# Patient Record
Sex: Female | Born: 1965
Health system: Southern US, Community
[De-identification: ages and names within clinical notes are randomized; demographics above are authoritative.]

---

## 2011-10-16 ENCOUNTER — Encounter: Payer: Self-pay | Admitting: Internal Medicine

## 2011-10-16 ENCOUNTER — Ambulatory Visit (INDEPENDENT_AMBULATORY_CARE_PROVIDER_SITE_OTHER): Payer: Commercial Managed Care - PPO | Admitting: Internal Medicine

## 2011-10-16 VITALS — BP 100/60 | HR 60 | Temp 97.3°F | Resp 12 | Ht 61.5 in | Wt 123.0 lb

## 2011-10-16 DIAGNOSIS — Z0000 Encounter for general adult medical examination without abnormal findings: Secondary | ICD-10-CM | POA: Insufficient documentation

## 2011-10-16 DIAGNOSIS — Z113 Encounter for screening for infections with a predominantly sexual mode of transmission: Secondary | ICD-10-CM

## 2011-10-16 DIAGNOSIS — Z1272 Encounter for screening for malignant neoplasm of vagina: Secondary | ICD-10-CM

## 2011-10-16 DIAGNOSIS — Z139 Encounter for screening, unspecified: Secondary | ICD-10-CM

## 2011-10-16 DIAGNOSIS — M2550 Pain in unspecified joint: Secondary | ICD-10-CM

## 2011-10-16 DIAGNOSIS — Z01419 Encounter for gynecological examination (general) (routine) without abnormal findings: Secondary | ICD-10-CM

## 2011-10-16 LAB — POCT URINALYSIS DIPSTICK
Bilirubin, UA: NEGATIVE
Ketones, UA: NEGATIVE
Protein, UA: NEGATIVE
Spec Grav, UA: 1.02
pH, UA: 6.5

## 2011-10-16 NOTE — Progress Notes (Signed)
Subjective:    Patient ID: Christy Martinez, female    DOB: 10/27/1966, 45 y.o.   MRN: [id number]  HPI New pt here for first visit.  Former pt of Dr. Ethelene Hal in [place].   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  NO FH of breast or GYN cancers  She has joint pain in DIP joint of 5th digits both hands.  No redness or warmth.  At time MTP joint of R great toe will bother her  No Known Allergies History reviewed. No pertinent past medical history. History reviewed. No pertinent past surgical history. History   Social History  . Marital Status: Married    Spouse Name: N/A    Number of Children: N/A  . Years of Education: N/A   Occupational History  . Not on file.   Social History Main Topics  . Smoking status: Never Smoker   . Smokeless tobacco: Never Used  . Alcohol Use: No  . Drug Use: No  . Sexually Active: Yes    Birth Control/ Protection: Coitus interruptus   Other Topics Concern  . Not on file   Social History Narrative  . No narrative on file   Family History  Problem Relation Age of Onset  . Diabetes Father   . Hypertension Father    Patient Active Problem List  Diagnoses  . Joint pain  . Screening   No current outpatient prescriptions on file prior to visit.       Review of Systems No chest pain , no sob NO DOE No LE edema No blood or change in color of stool  No dyspepsi No dysuria or urgency    Objective:   Physical Exam No Known Allergies History reviewed. No pertinent past medical history. History reviewed. No pertinent past surgical history. History   Social History  . Marital Status: Married    Spouse Name: N/A    Number of Children: N/A  . Years of Education: N/A   Occupational History  . Not on file.   Social History Main Topics  . Smoking status: Never Smoker     . Smokeless tobacco: Never Used  . Alcohol Use: No  . Drug Use: No  . Sexually Active: Yes    Birth Control/ Protection: Coitus interruptus   Other Topics Concern  . Not on file   Social History Narrative  . No narrative on file   Family History  Problem Relation Age of Onset  . Diabetes Father   . Hypertension Father    Patient Active Problem List  Diagnoses  . Joint pain  . Screening   No current outpatient prescriptions on file prior to visit.    Physical Exam  Vital signs and nursing note reviewed  Constitutional: She is oriented to person, place, and time. She appears well-developed and well-nourished. She is cooperative.  HENT:  Head: Normocephalic and atraumatic.  Right Ear: Tympanic membrane normal.  Left Ear: Tympanic membrane normal.  Nose: Nose normal.  Mouth/Throat: Oropharynx is clear and moist and mucous membranes are normal. No oropharyngeal exudate or posterior oropharyngeal erythema.  Eyes: Conjunctivae and EOM are normal. Pupils are equal, round, and reactive to light.  Neck: Neck supple. No JVD present. Carotid bruit is not present. No mass and  no thyromegaly present.  Cardiovascular: Regular rhythm, normal heart sounds, intact distal pulses and normal pulses.  Exam reveals no gallop and no friction rub.   No murmur heard. Pulses:      Dorsalis pedis pulses are 2+ on the right side, and 2+ on the left side.  Pulmonary/Chest: Breath sounds normal. She has no wheezes. She has no rhonchi. She has no rales. Right breast exhibits no mass, no nipple discharge and no skin change. Left breast exhibits no mass, no nipple discharge and no skin change.  Abdominal: Soft. Bowel sounds are normal. She exhibits no distension and no mass. There is no hepatosplenomegaly. There is no tenderness. There is no CVA tenderness.  Genitourinary: Rectum normal, vagina normal and uterus normal. Rectal exam shows no mass. Guaiac negative stool. No labial fusion. There is no lesion  on the right labia. There is no lesion on the left labia. Cervix exhibits no motion tenderness. Right adnexum displays no mass, no tenderness and no fullness. Left adnexum displays no mass, no tenderness and no fullness. No erythema around the vagina.  Musculoskeletal:       No active synovitis to any joint.    Lymphadenopathy:       Right cervical: No superficial cervical adenopathy present.      Left cervical: No superficial cervical adenopathy present.       Right axillary: No pectoral and no lateral adenopathy present.       Left axillary: No pectoral and no lateral adenopathy present.      Right: No inguinal adenopathy present.       Left: No inguinal adenopathy present.  Neurological: She is alert and oriented to person, place, and time. She has normal strength and normal reflexes. No cranial nerve deficit or sensory deficit. She displays a negative Romberg sign. Coordination and gait normal.  Skin: Skin is warm and dry. No abrasion, no bruising, no ecchymosis and no rash noted. No cyanosis. Nails show no clubbing.  Psychiatric: She has a normal mood and affect. Her speech is normal and behavior is normal.          Assessment & Plan:  Health Maintenance;  She is to go to employee health for TDAP today.  Had influenza  See scanned HM sheet 2)  Joint arthraligias 5th digit;  OK to try empiric glucosamine/chondroitin for 4-6 weeks.  If it does not help counseled to stop med       Assessment & Plan:

## 2011-10-16 NOTE — Patient Instructions (Signed)
Labs will be mailed to you  Can try Glucosamine chondroitin g as directed for joint pain  Get Tdap at employee health fax to [place]

## 2011-10-18 LAB — COMPREHENSIVE METABOLIC PANEL
AST: 13 U/L (ref 0–37)
Albumin: 4.4 g/dL (ref 3.5–5.2)
BUN: 13 mg/dL (ref 6–23)
Calcium: 8.9 mg/dL (ref 8.4–10.5)
Chloride: 105 mEq/L (ref 96–112)
Creat: 0.67 mg/dL (ref 0.50–1.10)
Glucose, Bld: 87 mg/dL (ref 70–99)
Potassium: 3.9 mEq/L (ref 3.5–5.3)

## 2011-10-18 LAB — CBC WITH DIFFERENTIAL/PLATELET
Basophils Absolute: 0 10*3/uL (ref 0.0–0.1)
Eosinophils Relative: 2 % (ref 0–5)
HCT: 39.5 % (ref 36.0–46.0)
Hemoglobin: 13.3 g/dL (ref 12.0–15.0)
Lymphocytes Relative: 36 % (ref 12–46)
MCHC: 33.7 g/dL (ref 30.0–36.0)
MCV: 93.4 fL (ref 78.0–100.0)
Monocytes Absolute: 0.5 10*3/uL (ref 0.1–1.0)
Monocytes Relative: 11 % (ref 3–12)
Neutro Abs: 2.2 10*3/uL (ref 1.7–7.7)
RDW: 12.7 % (ref 11.5–15.5)

## 2011-10-18 LAB — LIPID PANEL
Cholesterol: 171 mg/dL (ref 0–200)
HDL: 57 mg/dL (ref >39)
LDL Cholesterol: 102 mg/dL — ABNORMAL HIGH (ref 0–99)
Triglycerides: 61 mg/dL (ref <150)

## 2011-10-18 LAB — TSH: TSH: 1.181 u[IU]/mL (ref 0.350–4.500)

## 2011-10-20 ENCOUNTER — Encounter: Payer: Self-pay | Admitting: Emergency Medicine

## 2011-10-21 ENCOUNTER — Encounter: Payer: Self-pay | Admitting: Emergency Medicine

## 2011-10-24 ENCOUNTER — Encounter: Payer: Self-pay | Admitting: Internal Medicine

## 2012-09-23 ENCOUNTER — Ambulatory Visit (INDEPENDENT_AMBULATORY_CARE_PROVIDER_SITE_OTHER): Payer: Commercial Managed Care - PPO | Admitting: Internal Medicine

## 2012-09-23 ENCOUNTER — Encounter: Payer: Self-pay | Admitting: Internal Medicine

## 2012-09-23 VITALS — BP 108/68 | HR 74 | Temp 97.0°F | Resp 16 | Ht 62.0 in | Wt 128.0 lb

## 2012-09-23 DIAGNOSIS — M5430 Sciatica, unspecified side: Secondary | ICD-10-CM | POA: Insufficient documentation

## 2012-09-23 MED ORDER — METAXALONE 800 MG PO TABS: 800.0000 mg | ORAL_TABLET | Freq: Three times a day (TID) | ORAL | Status: DC

## 2012-09-23 MED ORDER — NABUMETONE 500 MG PO TABS: ORAL_TABLET | ORAL | Status: DC

## 2012-09-23 NOTE — Progress Notes (Signed)
  Subjective:    Patient ID: Christy Martinez, female    DOB: Aug 27, 1966, 46 y.o.   MRN: [id number]  HPI [name] is here for acute visit.    She for the last month has had Low back pain L >R and has pain going down L leg. Shehas had this before but it has been daily for the past 4 weeks.  No CVA pain no dysuria no urinary urgency.    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 No pertinent past medical history. History reviewed. No pertinent past surgical history. History   Social History  . Marital Status: Married    Spouse Name: N/A    Number of Children: N/A  . Years of Education: N/A   Occupational History  . Not on file.   Social History Main Topics  . Smoking status: Never Smoker   . Smokeless tobacco: Never Used  . Alcohol Use: No  . Drug Use: No  . Sexually Active: Yes    Birth Control/ Protection: Coitus interruptus   Other Topics Concern  . Not on file   Social History Narrative  . No narrative on file   Family History  Problem Relation Age of Onset  . Diabetes Father   . Hypertension Father    Patient Active Problem List  Diagnosis  . Joint pain  . Screening   Current Outpatient Prescriptions on File Prior to Visit  Medication Sig Dispense Refill  . Calcium Carbonate-Vitamin D (CALTRATE 600+D) 600-400 MG-UNIT per tablet Take 1 tablet by mouth daily.        . Cyanocobalamin (VITAMIN B-12 CR PO) Take 1 tablet by mouth daily.             Review of Systems    see HPI Objective:   Physical Exam Physical Exam  Nursing note and vitals reviewed.  Constitutional: She is oriented to person, place, and time. She appears well-developed and well-nourished.  HENT:  Head: Normocephalic and atraumatic.  Cardiovascular: Normal rate and regular rhythm. Exam reveals no gallop and no friction rub.  No murmur heard.  Pulmonary/Chest: Breath sounds normal. She has no wheezes. She has no  rales.  Neurological: She is alert and oriented to person, place, and time.  LE exam:   SLR positive on L.   Reflexes 2 + symmetric lowe est Sensation intact Motor 5/5 lower extremities Skin: Skin is warm and dry.  Psychiatric: She has a normal mood and affect. Her behavior is normal.              Assessment & Plan:  Sciatica:  Will treat with Relafen 500 mg bid with food.  Skelaxin 800 mg q8h as needed.  Will refer to PT  Back pain  See above    She is to see me in 3-4 weeks.  If no improvement ,  May need imaging

## 2012-09-23 NOTE — Patient Instructions (Addendum)
Take meds as prescribed   Will start physical therapy

## 2012-09-29 ENCOUNTER — Encounter: Payer: Self-pay | Admitting: *Deleted

## 2013-09-04 ENCOUNTER — Telehealth: Payer: Self-pay | Admitting: Internal Medicine

## 2013-09-04 NOTE — Telephone Encounter (Signed)
Left message on pts mobile to call office regarding extreme breast density on mm

## 2013-09-19 ENCOUNTER — Telehealth: Payer: Self-pay | Admitting: Internal Medicine

## 2013-09-19 NOTE — Telephone Encounter (Signed)
Left message again for pt to call office regarading mm results of extreme breast density

## 2013-10-21 ENCOUNTER — Encounter: Payer: Self-pay | Admitting: *Deleted

## 2014-02-27 ENCOUNTER — Encounter: Payer: Self-pay | Admitting: Internal Medicine

## 2014-02-27 ENCOUNTER — Ambulatory Visit (INDEPENDENT_AMBULATORY_CARE_PROVIDER_SITE_OTHER): Payer: Commercial Managed Care - PPO | Admitting: Internal Medicine

## 2014-02-27 ENCOUNTER — Other Ambulatory Visit: Payer: Self-pay | Admitting: Internal Medicine

## 2014-02-27 VITALS — BP 90/53 | HR 65 | Temp 98.3°F | Resp 18 | Ht 62.0 in | Wt 125.0 lb

## 2014-02-27 DIAGNOSIS — N6459 Other signs and symptoms in breast: Secondary | ICD-10-CM

## 2014-02-27 DIAGNOSIS — Z124 Encounter for screening for malignant neoplasm of cervix: Secondary | ICD-10-CM

## 2014-02-27 DIAGNOSIS — Z139 Encounter for screening, unspecified: Secondary | ICD-10-CM

## 2014-02-27 DIAGNOSIS — Z0000 Encounter for general adult medical examination without abnormal findings: Secondary | ICD-10-CM

## 2014-02-27 DIAGNOSIS — M2550 Pain in unspecified joint: Secondary | ICD-10-CM

## 2014-02-27 DIAGNOSIS — Z1151 Encounter for screening for human papillomavirus (HPV): Secondary | ICD-10-CM

## 2014-02-27 DIAGNOSIS — R922 Inconclusive mammogram: Secondary | ICD-10-CM | POA: Insufficient documentation

## 2014-02-27 LAB — URIC ACID: URIC ACID, SERUM: 4.7 mg/dL (ref 2.4–7.0)

## 2014-02-27 LAB — LIPID PANEL
CHOL/HDL RATIO: 2.6 ratio
Cholesterol: 169 mg/dL (ref 0–200)
HDL: 66 mg/dL (ref >39)
LDL CALC: 92 mg/dL (ref 0–99)
TRIGLYCERIDES: 55 mg/dL (ref <150)
VLDL: 11 mg/dL (ref 0–40)

## 2014-02-27 LAB — CBC WITH DIFFERENTIAL/PLATELET
BASOS ABS: 0 10*3/uL (ref 0.0–0.1)
BASOS PCT: 1 % (ref 0–1)
EOS PCT: 2 % (ref 0–5)
Eosinophils Absolute: 0.1 10*3/uL (ref 0.0–0.7)
HEMATOCRIT: 37.8 % (ref 36.0–46.0)
Hemoglobin: 12.9 g/dL (ref 12.0–15.0)
LYMPHS PCT: 45 % (ref 12–46)
Lymphs Abs: 1.6 10*3/uL (ref 0.7–4.0)
MCH: 31.3 pg (ref 26.0–34.0)
MCHC: 34.1 g/dL (ref 30.0–36.0)
MCV: 91.7 fL (ref 78.0–100.0)
MONO ABS: 0.4 10*3/uL (ref 0.1–1.0)
Monocytes Relative: 12 % (ref 3–12)
Neutro Abs: 1.4 10*3/uL — ABNORMAL LOW (ref 1.7–7.7)
Neutrophils Relative %: 40 % — ABNORMAL LOW (ref 43–77)
Platelets: 206 10*3/uL (ref 150–400)
RBC: 4.12 MIL/uL (ref 3.87–5.11)
RDW: 13.6 % (ref 11.5–15.5)
WBC: 3.6 10*3/uL — ABNORMAL LOW (ref 4.0–10.5)

## 2014-02-27 LAB — POCT URINALYSIS DIPSTICK
Bilirubin, UA: NEGATIVE
Blood, UA: NEGATIVE
Glucose, UA: NEGATIVE
KETONES UA: NEGATIVE
LEUKOCYTES UA: NEGATIVE
Nitrite, UA: NEGATIVE
Protein, UA: NEGATIVE
Spec Grav, UA: 1.015
Urobilinogen, UA: NEGATIVE
pH, UA: 6.5

## 2014-02-27 LAB — COMPREHENSIVE METABOLIC PANEL
ALT: 13 U/L (ref 0–35)
AST: 18 U/L (ref 0–37)
Albumin: 4.2 g/dL (ref 3.5–5.2)
Alkaline Phosphatase: 35 U/L — ABNORMAL LOW (ref 39–117)
BILIRUBIN TOTAL: 0.6 mg/dL (ref 0.2–1.2)
BUN: 10 mg/dL (ref 6–23)
CO2: 27 mEq/L (ref 19–32)
CREATININE: 0.55 mg/dL (ref 0.50–1.10)
Calcium: 9 mg/dL (ref 8.4–10.5)
Chloride: 105 mEq/L (ref 96–112)
Glucose, Bld: 87 mg/dL (ref 70–99)
Potassium: 4.3 mEq/L (ref 3.5–5.3)
Sodium: 140 mEq/L (ref 135–145)
Total Protein: 7.1 g/dL (ref 6.0–8.3)

## 2014-02-27 NOTE — Progress Notes (Signed)
   Subjective:    Patient ID: Christy AschoffMary Christy Martinez, female    DOB: 06/01/1966, 48 y.o.   MRN: [id number]  HPI Christy ShinerGrace is here for CPE.  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 [employer]  See MM  She has extreme breast density  She also report one episode of red toe with pain.  Only lasted a few days.    Review of Systems     Objective:   Physical Exam  Physical Exam  Vital signs and nursing note reviewed  Constitutional: She is oriented to person, place, and time. She appears well-developed and well-nourished. She is cooperative.  HENT:  Head: Normocephalic and atraumatic.  Right Ear: Tympanic membrane normal.  Left Ear: Tympanic membrane normal.  Nose: Nose normal.  Mouth/Throat: Oropharynx is clear and moist and mucous membranes are normal. No oropharyngeal exudate or posterior oropharyngeal erythema.  Eyes: Conjunctivae and EOM are normal. Pupils are equal, round, and reactive to light.  Neck: Neck supple. No JVD present. Carotid bruit is not present. No mass and no thyromegaly present.  Cardiovascular: Regular rhythm, normal heart sounds, intact distal pulses and normal pulses.  Exam reveals no gallop and no friction rub.   No murmur heard. Pulses:      Dorsalis pedis pulses are 2+ on the right side, and 2+ on the left side.  Pulmonary/Chest: Breath sounds normal. She has no wheezes. She has no rhonchi. She has no rales. Right breast exhibits no mass, no nipple discharge and no skin change. Left breast exhibits no mass, no nipple discharge and no skin change.  Abdominal: Soft. Bowel sounds are normal. She exhibits no distension and no mass. There is no hepatosplenomegaly. There is no tenderness. There is no CVA tenderness.  Genitourinary: Rectum normal, vagina normal and uterus normal. Rectal exam shows no mass. . No labial fusion. There is no lesion on the right labia. There is no lesion on the left labia. Cervix exhibits no motion tenderness. Right adnexum displays no mass, no tenderness and no  fullness. Left adnexum displays no mass, no tenderness and no fullness. No erythema around the vagina.  Musculoskeletal:       No active synovitis to any joint.    Lymphadenopathy:       Right cervical: No superficial cervical adenopathy present.      Left cervical: No superficial cervical adenopathy present.       Right axillary: No pectoral and no lateral adenopathy present.       Left axillary: No pectoral and no lateral adenopathy present.      Right: No inguinal adenopathy present.       Left: No inguinal adenopathy present.  Neurological: She is alert and oriented to person, place, and time. She has normal strength and normal reflexes. No cranial nerve deficit or sensory deficit. She displays a negative Romberg sign. Coordination and gait normal.  Skin: Skin is warm and dry. No abrasion, no bruising, no ecchymosis and no rash noted. No cyanosis. Nails show no clubbing.  Psychiatric: She has a normal mood and affect. Her speech is normal and behavior is normal.          Assessment & Plan:  Health maintenance:    Extreme breast density  Advised 3D mm next year  Toe pain and redness  Will check uric acid today.  Clinical exam no synovitis  Labs today see me as needed         Assessment & Plan:

## 2014-02-28 LAB — TSH: TSH: 1.347 u[IU]/mL (ref 0.350–4.500)

## 2014-02-28 LAB — VITAMIN D 25 HYDROXY (VIT D DEFICIENCY, FRACTURES): Vit D, 25-Hydroxy: 45 ng/mL (ref 30–89)

## 2014-03-01 ENCOUNTER — Encounter: Payer: Self-pay | Admitting: *Deleted

## 2014-06-20 ENCOUNTER — Encounter: Payer: Self-pay | Admitting: Internal Medicine

## 2014-06-20 ENCOUNTER — Ambulatory Visit (INDEPENDENT_AMBULATORY_CARE_PROVIDER_SITE_OTHER): Payer: 59 | Admitting: Internal Medicine

## 2014-06-20 VITALS — BP 110/68 | HR 60 | Temp 98.6°F | Resp 16 | Ht 62.0 in | Wt 121.0 lb

## 2014-06-20 DIAGNOSIS — R21 Rash and other nonspecific skin eruption: Secondary | ICD-10-CM

## 2014-06-20 MED ORDER — HYDROXYZINE HCL 10 MG PO TABS: ORAL_TABLET | ORAL | Status: DC

## 2014-06-20 MED ORDER — PERMETHRIN 5 % EX CREA: 1.0000 "application " | TOPICAL_CREAM | Freq: Once | CUTANEOUS | Status: DC

## 2014-06-20 MED ORDER — DOXYCYCLINE HYCLATE 100 MG PO TABS: 100.0000 mg | ORAL_TABLET | Freq: Two times a day (BID) | ORAL | Status: DC

## 2014-06-20 NOTE — Patient Instructions (Signed)
Call my office if no improvement  Keep appointment with dermatologist

## 2014-06-20 NOTE — Progress Notes (Signed)
   Subjective:    Patient ID: Christy Martinez, female    DOB: 02/04/1966, 48 y.o.   MRN: [id number]  HPI  Christy DandyMary is here for acute visit  Itchy maculopapular rash  Began over both lower arms 3 weeks ago and know has spread to chest, back and both legs.    Treated with OTC HC only.  She works as Engineer, civil (consulting)nurse on urology unit at [employer]   No headache no fever.  Tried new detergent no new foods or medications.    She walks outside  For 1 hour daily    No Known Allergies No past medical history on file. No past surgical history on file. History   Social History  . Marital Status: Married    Spouse Name: N/A    Number of Children: N/A  . Years of Education: N/A   Occupational History  . Not on file.   Social History Main Topics  . Smoking status: Never Smoker   . Smokeless tobacco: Never Used  . Alcohol Use: No  . Drug Use: No  . Sexual Activity: Yes    Birth Control/ Protection: Coitus interruptus   Other Topics Concern  . Not on file   Social History Narrative  . No narrative on file   Family History  Problem Relation Age of Onset  . Diabetes Father   . Hypertension Father    Patient Active Problem List   Diagnosis Date Noted  . Breast density 02/27/2014  . Sciatica 09/23/2012  . Joint pain 10/16/2011  . Screening 10/16/2011   Current Outpatient Prescriptions on File Prior to Visit  Medication Sig Dispense Refill  . acetaminophen (TYLENOL) 500 MG tablet Take 500 mg by mouth every 6 (six) hours as needed.      . Calcium Carbonate-Vitamin D (CALTRATE 600+D) 600-400 MG-UNIT per tablet Take 1 tablet by mouth daily.        . Cyanocobalamin (VITAMIN B-12 CR PO) Take 1 tablet by mouth daily.        . metaxalone (SKELAXIN) 800 MG tablet Take 1 tablet (800 mg total) by mouth 3 (three) times daily.  30 tablet  0  . nabumetone (RELAFEN) 500 MG tablet Take one tablet twice a day with food  60 tablet  1   No current facility-administered medications on file prior to visit.       Review of Systems See HPI    Objective:   Physical Exam  Physical Exam  Nursing note and vitals reviewed.  Constitutional: She is oriented to person, place, and time. She appears well-developed and well-nourished.  HENT:  Head: Normocephalic and atraumatic.  Cardiovascular: Normal rate and regular rhythm. Exam reveals no gallop and no friction rub.  No murmur heard.  Pulmonary/Chest: Breath sounds normal. She has no wheezes. She has no rales.  Neurological: She is alert and oriented to person, place, and time.  Skin: Skin is warm and dry.  She has multiple maculopapular lesion from neck down.     No blisters.    No cellulitis  Psychiatric: She has a normal mood and affect. Her behavior is normal.             Assessment & Plan:  Probable scabies : will treat with elimite -repeat in 1 week.  Pt has appt with dermatology group later this month.      Will also empirically treat with Doxycycline   100 mg bid   Call me if no improvement

## 2014-07-18 ENCOUNTER — Other Ambulatory Visit: Payer: Self-pay | Admitting: Internal Medicine

## 2014-07-18 DIAGNOSIS — Z1231 Encounter for screening mammogram for malignant neoplasm of breast: Secondary | ICD-10-CM

## 2014-08-24 ENCOUNTER — Ambulatory Visit (INDEPENDENT_AMBULATORY_CARE_PROVIDER_SITE_OTHER): Payer: 59 | Admitting: Internal Medicine

## 2014-08-24 ENCOUNTER — Encounter: Payer: Self-pay | Admitting: Internal Medicine

## 2014-08-24 VITALS — BP 104/60 | HR 68 | Temp 98.0°F | Resp 16 | Wt 125.0 lb

## 2014-08-24 DIAGNOSIS — L500 Allergic urticaria: Secondary | ICD-10-CM

## 2014-08-24 DIAGNOSIS — Z91012 Allergy to eggs: Secondary | ICD-10-CM

## 2014-08-24 MED ORDER — PREDNISONE 20 MG PO TABS: 20.0000 mg | ORAL_TABLET | Freq: Every day | ORAL | Status: DC

## 2014-08-24 NOTE — Patient Instructions (Signed)
[facility]

## 2014-08-24 NOTE — Progress Notes (Signed)
   Subjective:    Patient ID: Christy Martinez, female    DOB: 03/22/1966, 48 y.o.   MRN: [id number]  HPI  02/2014 note Health maintenance:  Extreme breast density Advised 3D mm next year  Toe pain and redness Will check uric acid today. Clinical exam no synovitis  Labs today see me as needed   06/2014  Probable scabies : will treat with elimite -repeat in 1 week. Pt has appt with dermatology group later this month.  Will also empirically treat with Doxycycline 100 mg bid  Call me if no improvement  Today     Pt saw Dr. Danella DeisGruber and scabies negative    She ate chicken and eggs last Wednesday and immediately broke out in hives.   Picture on cell phone shows what appears to be urticaria a few hours after eating chicken/eggs.   Took benadryl and leftover Prednisone from Dr. Danella DeisGruber and rash is better   She has not had flu vaccine      Review of Systems    see HPI  Objective:   Physical Exam Physical Exam  Nursing note and vitals reviewed.  Constitutional: She is oriented to person, place, and time. She appears well-developed and well-nourished.  HENT:  Head: Normocephalic and atraumatic.  Cardiovascular: Normal rate and regular rhythm. Exam reveals no gallop and no friction rub.  No murmur heard.  Pulmonary/Chest: Breath sounds normal. She has no wheezes. She has no rales.  Neurological: She is alert and oriented to person, place, and time.  Skin: Skin is warm and dry.  Resolving urticarial lesion Psychiatric: She has a normal mood and affect. Her behavior is normal.        Assessment & Plan:  Urticarial lesion after eating chicken/eggs    Pt is to be exempt from flu vaccine Will give pt number to [facility] to be tested Will give prednisone 20 mg for 4 more days

## 2014-09-11 ENCOUNTER — Encounter: Payer: Self-pay | Admitting: Internal Medicine

## 2014-10-23 ENCOUNTER — Ambulatory Visit (HOSPITAL_COMMUNITY)
Admission: RE | Admit: 2014-10-23 | Discharge: 2014-10-23 | Disposition: A | Discharge status: Home / Self Care | Payer: 59 | Source: Ambulatory Visit | Attending: Internal Medicine | Admitting: Internal Medicine

## 2014-10-23 DIAGNOSIS — Z1231 Encounter for screening mammogram for malignant neoplasm of breast: Secondary | ICD-10-CM | POA: Insufficient documentation

## 2015-03-04 NOTE — Progress Notes (Signed)
Subjective:    Patient ID: Christy AschoffMary Grace Martinez, female    DOB: 11/13/1965, 49 y.o.   MRN: [id number]  HPI  02/2014 note Health maintenance:   Extreme breast density Advised 3D mm next year  Toe pain and redness Will check uric acid today. Clinical exam no synovitis  Labs today see me as needed  TODAY  Christy Martinez is here for CPE  HM  Pap due 2018    Mm due December   Pt is a nonsmoker  Reports numbness of first 3 digits of R hand.  Notes especially at night  Problem list reviewed  Allergies  Allergen Reactions  . Eggs Or Egg-Derived Products     urticaria   No past medical history on file. No past surgical history on file. History   Social History  . Marital Status: Married    Spouse Name: N/A  . Number of Children: N/A  . Years of Education: N/A   Occupational History  . Not on file.   Social History Main Topics  . Smoking status: Never Smoker   . Smokeless tobacco: Never Used  . Alcohol Use: No  . Drug Use: No  . Sexual Activity: Yes    Birth Control/ Protection: Coitus interruptus   Other Topics Concern  . Not on file   Social History Narrative   Family History  Problem Relation Age of Onset  . Diabetes Father   . Hypertension Father    Patient Active Problem List   Diagnosis Date Noted  . Breast density 02/27/2014  . Sciatica 09/23/2012  . Joint pain 10/16/2011  . Screening 10/16/2011   Current Outpatient Prescriptions on File Prior to Visit  Medication Sig Dispense Refill  . acetaminophen (TYLENOL) 500 MG tablet Take 500 mg by mouth every 6 (six) hours as needed.    . Calcium Carbonate-Vitamin D (CALTRATE 600+D) 600-400 MG-UNIT per tablet Take 1 tablet by mouth daily.      . Cyanocobalamin (VITAMIN B-12 CR PO) Take 1 tablet by mouth daily.      . predniSONE (DELTASONE) 20 MG tablet Take 1 tablet (20 mg total) by mouth daily with breakfast. 4 tablet 0   No current facility-administered medications on file prior to visit.      Review of  Systems  Respiratory: Negative for cough, chest tightness, shortness of breath and wheezing.   Cardiovascular: Negative for chest pain, palpitations and leg swelling.  All other systems reviewed and are negative.      Objective:   Physical Exam Physical Exam  Nursing note and vitals reviewed.  Constitutional: She is oriented to person, place, and time. She appears well-developed and well-nourished.  HENT:  Head: Normocephalic and atraumatic.  Right Ear: Tympanic membrane and ear canal normal. No drainage. Tympanic membrane is not injected and not erythematous.  Left Ear: Tympanic membrane and ear canal normal. No drainage. Tympanic membrane is not injected and not erythematous.  Nose: Nose normal. Right sinus exhibits no maxillary sinus tenderness and no frontal sinus tenderness. Left sinus exhibits no maxillary sinus tenderness and no frontal sinus tenderness.  Mouth/Throat: Oropharynx is clear and moist. No oral lesions. No oropharyngeal exudate.  Eyes: Conjunctivae and EOM are normal. Pupils are equal, round, and reactive to light.  Neck: Normal range of motion. Neck supple. No JVD present. Carotid bruit is not present. No mass and no thyromegaly present.  Cardiovascular: Normal rate, regular rhythm, S1 normal, S2 normal and intact distal pulses. Exam reveals no gallop and no friction  rub.  No murmur heard.  Pulses:  Carotid pulses are 2+ on the right side, and 2+ on the left side.  Dorsalis pedis pulses are 2+ on the right side, and 2+ on the left side.  No carotid bruit. No LE edema  Pulmonary/Chest: Breath sounds normal. She has no wheezes. She has no rales. She exhibits no tenderness.   Breast no discrete mass no nipple discharge no axillary adenopathy bilaterally  Abdominal: Soft. Bowel sounds are normal. She exhibits no distension and no mass. There is no hepatosplenomegaly. There is no tenderness. There is no CVA tenderness.  Musculoskeletal: Normal range of motion.  R wrist   Tinel's Phalens negative  No active synovitis to joints.  Lymphadenopathy:  She has no cervical adenopathy.  She has no axillary adenopathy.  Right: No inguinal and no supraclavicular adenopathy present.  Left: No inguinal and no supraclavicular adenopathy present.  Neurological: She is alert and oriented to person, place, and time. She has normal strength and normal reflexes. She displays no tremor. No cranial nerve deficit or sensory deficit. Coordination and gait normal.  Skin: Skin is warm and dry. No rash noted. No cyanosis. Nails show no clubbing.  Psychiatric: She has a normal mood and affect. Her speech is normal and behavior is normal. Cognition and memory are normal.           Assessment & Plan:  HM  Advised 3D mm in December  Pap due 2018  Non smoker  R carpal tunnel  Pt to get carpal tunnel splint  Start by wearing at night   Extreme breast density see above  Labs today

## 2015-03-05 ENCOUNTER — Ambulatory Visit (INDEPENDENT_AMBULATORY_CARE_PROVIDER_SITE_OTHER): Payer: 59 | Admitting: Internal Medicine

## 2015-03-05 ENCOUNTER — Encounter: Payer: Self-pay | Admitting: Internal Medicine

## 2015-03-05 ENCOUNTER — Encounter: Payer: Self-pay | Admitting: *Deleted

## 2015-03-05 VITALS — BP 106/63 | HR 57 | Resp 16 | Ht 62.0 in | Wt 127.0 lb

## 2015-03-05 DIAGNOSIS — M5430 Sciatica, unspecified side: Secondary | ICD-10-CM | POA: Diagnosis not present

## 2015-03-05 DIAGNOSIS — N6489 Other specified disorders of breast: Secondary | ICD-10-CM

## 2015-03-05 DIAGNOSIS — G5601 Carpal tunnel syndrome, right upper limb: Secondary | ICD-10-CM

## 2015-03-05 DIAGNOSIS — Z0000 Encounter for general adult medical examination without abnormal findings: Secondary | ICD-10-CM | POA: Diagnosis not present

## 2015-03-05 DIAGNOSIS — R9230 Dense breasts, unspecified: Secondary | ICD-10-CM

## 2015-03-05 DIAGNOSIS — R922 Inconclusive mammogram: Secondary | ICD-10-CM

## 2015-03-05 LAB — LIPID PANEL
CHOL/HDL RATIO: 2.8 ratio
Cholesterol: 153 mg/dL (ref 0–200)
HDL: 55 mg/dL (ref >=46)
LDL Cholesterol: 84 mg/dL (ref 0–99)
Triglycerides: 68 mg/dL (ref <150)
VLDL: 14 mg/dL (ref 0–40)

## 2015-03-05 LAB — COMPLETE METABOLIC PANEL WITH GFR
ALBUMIN: 4 g/dL (ref 3.5–5.2)
ALT: 11 U/L (ref 0–35)
AST: 16 U/L (ref 0–37)
Alkaline Phosphatase: 41 U/L (ref 39–117)
BUN: 10 mg/dL (ref 6–23)
CALCIUM: 9.1 mg/dL (ref 8.4–10.5)
CO2: 27 mEq/L (ref 19–32)
Chloride: 103 mEq/L (ref 96–112)
Creat: 0.62 mg/dL (ref 0.50–1.10)
Glucose, Bld: 89 mg/dL (ref 70–99)
POTASSIUM: 4.4 meq/L (ref 3.5–5.3)
Sodium: 139 mEq/L (ref 135–145)
TOTAL PROTEIN: 7.1 g/dL (ref 6.0–8.3)
Total Bilirubin: 0.4 mg/dL (ref 0.2–1.2)

## 2015-03-05 LAB — TSH: TSH: 1.43 u[IU]/mL (ref 0.350–4.500)

## 2015-03-06 LAB — CBC WITH DIFFERENTIAL/PLATELET
Basophils Absolute: 0 10*3/uL (ref 0.0–0.1)
Basophils Relative: 1 % (ref 0–1)
Eosinophils Absolute: 0.1 10*3/uL (ref 0.0–0.7)
Eosinophils Relative: 3 % (ref 0–5)
HEMATOCRIT: 38.4 % (ref 36.0–46.0)
HEMOGLOBIN: 12.8 g/dL (ref 12.0–15.0)
LYMPHS ABS: 1.5 10*3/uL (ref 0.7–4.0)
Lymphocytes Relative: 43 % (ref 12–46)
MCH: 30.8 pg (ref 26.0–34.0)
MCHC: 33.3 g/dL (ref 30.0–36.0)
MCV: 92.5 fL (ref 78.0–100.0)
MONOS PCT: 11 % (ref 3–12)
MPV: 9.8 fL (ref 8.6–12.4)
Monocytes Absolute: 0.4 10*3/uL (ref 0.1–1.0)
Neutro Abs: 1.5 10*3/uL — ABNORMAL LOW (ref 1.7–7.7)
Neutrophils Relative %: 42 % — ABNORMAL LOW (ref 43–77)
Platelets: 269 10*3/uL (ref 150–400)
RBC: 4.15 MIL/uL (ref 3.87–5.11)
RDW: 12.8 % (ref 11.5–15.5)
WBC: 3.6 10*3/uL — ABNORMAL LOW (ref 4.0–10.5)

## 2015-03-06 LAB — VITAMIN D 25 HYDROXY (VIT D DEFICIENCY, FRACTURES): Vit D, 25-Hydroxy: 38 ng/mL (ref 30–100)

## 2015-03-07 ENCOUNTER — Encounter: Payer: Self-pay | Admitting: *Deleted

## 2015-09-28 ENCOUNTER — Other Ambulatory Visit: Payer: Self-pay

## 2015-09-28 DIAGNOSIS — Z1231 Encounter for screening mammogram for malignant neoplasm of breast: Secondary | ICD-10-CM

## 2015-10-31 ENCOUNTER — Ambulatory Visit: Payer: 59

## 2015-11-29 ENCOUNTER — Ambulatory Visit
Admission: RE | Admit: 2015-11-29 | Discharge: 2015-11-29 | Disposition: A | Discharge status: Home / Self Care | Payer: 59 | Source: Ambulatory Visit

## 2015-11-29 DIAGNOSIS — Z1231 Encounter for screening mammogram for malignant neoplasm of breast: Secondary | ICD-10-CM

## 2016-02-21 DIAGNOSIS — Z0000 Encounter for general adult medical examination without abnormal findings: Secondary | ICD-10-CM | POA: Diagnosis not present

## 2016-02-21 DIAGNOSIS — Z13220 Encounter for screening for lipoid disorders: Secondary | ICD-10-CM | POA: Diagnosis not present

## 2016-04-23 DIAGNOSIS — R05 Cough: Secondary | ICD-10-CM | POA: Diagnosis not present

## 2016-04-23 DIAGNOSIS — J309 Allergic rhinitis, unspecified: Secondary | ICD-10-CM | POA: Diagnosis not present

## 2016-04-23 DIAGNOSIS — J209 Acute bronchitis, unspecified: Secondary | ICD-10-CM | POA: Diagnosis not present

## 2016-04-23 MED FILL — AZITHROMYCIN 250 MG TABLET: 250 | 5 days supply | Qty: 6 | Fill #0

## 2016-04-23 MED FILL — HYDROCODONE-CHLORPHENIRAM S: 10-8 | 10 days supply | Qty: 100 | Fill #0

## 2016-07-25 DIAGNOSIS — R600 Localized edema: Secondary | ICD-10-CM | POA: Diagnosis not present

## 2016-07-25 DIAGNOSIS — H1032 Unspecified acute conjunctivitis, left eye: Secondary | ICD-10-CM | POA: Diagnosis not present

## 2016-07-25 MED FILL — OFLOXACIN 0.3% EYE DROPS: 0.3 | 20 days supply | Qty: 5 | Fill #0

## 2016-07-25 MED FILL — PREDNISOLONE AC 1% EYE DROP: 1 | 25 days supply | Qty: 5 | Fill #0

## 2016-10-22 ENCOUNTER — Other Ambulatory Visit: Payer: Self-pay | Admitting: Internal Medicine

## 2016-10-22 DIAGNOSIS — Z1231 Encounter for screening mammogram for malignant neoplasm of breast: Secondary | ICD-10-CM

## 2016-12-01 ENCOUNTER — Ambulatory Visit
Admission: RE | Admit: 2016-12-01 | Discharge: 2016-12-01 | Disposition: A | Discharge status: Home / Self Care | Payer: 59 | Source: Ambulatory Visit | Attending: Internal Medicine | Admitting: Internal Medicine

## 2016-12-01 DIAGNOSIS — Z1231 Encounter for screening mammogram for malignant neoplasm of breast: Secondary | ICD-10-CM

## 2017-02-26 ENCOUNTER — Other Ambulatory Visit (HOSPITAL_COMMUNITY)
Admission: RE | Admit: 2017-02-26 | Discharge: 2017-02-26 | Disposition: A | Discharge status: Home / Self Care | Payer: 59 | Source: Ambulatory Visit | Attending: Internal Medicine | Admitting: Internal Medicine

## 2017-02-26 ENCOUNTER — Other Ambulatory Visit: Payer: Self-pay | Admitting: Internal Medicine

## 2017-02-26 DIAGNOSIS — Z1151 Encounter for screening for human papillomavirus (HPV): Secondary | ICD-10-CM | POA: Diagnosis not present

## 2017-02-26 DIAGNOSIS — Z01419 Encounter for gynecological examination (general) (routine) without abnormal findings: Secondary | ICD-10-CM | POA: Insufficient documentation

## 2017-02-26 DIAGNOSIS — Z0000 Encounter for general adult medical examination without abnormal findings: Secondary | ICD-10-CM | POA: Diagnosis not present

## 2017-03-02 LAB — CYTOLOGY - PAP
Diagnosis: NEGATIVE
HPV: NOT DETECTED

## 2017-12-25 MED FILL — OSELTAMIVIR PHOSPHATE 75 MG: 75 | 5 days supply | Qty: 10 | Fill #0

## 2018-01-28 ENCOUNTER — Other Ambulatory Visit: Payer: Self-pay | Admitting: Internal Medicine

## 2018-01-28 DIAGNOSIS — Z1231 Encounter for screening mammogram for malignant neoplasm of breast: Secondary | ICD-10-CM

## 2018-02-24 ENCOUNTER — Ambulatory Visit
Admission: RE | Admit: 2018-02-24 | Discharge: 2018-02-24 | Disposition: A | Discharge status: Home / Self Care | Payer: 59 | Source: Ambulatory Visit | Attending: Internal Medicine | Admitting: Internal Medicine

## 2018-02-24 DIAGNOSIS — Z1231 Encounter for screening mammogram for malignant neoplasm of breast: Secondary | ICD-10-CM

## 2018-03-04 DIAGNOSIS — Z0000 Encounter for general adult medical examination without abnormal findings: Secondary | ICD-10-CM | POA: Diagnosis not present

## 2018-03-04 DIAGNOSIS — Z13220 Encounter for screening for lipoid disorders: Secondary | ICD-10-CM | POA: Diagnosis not present

## 2018-08-17 DIAGNOSIS — R42 Dizziness and giddiness: Secondary | ICD-10-CM | POA: Diagnosis not present

## 2018-08-17 MED FILL — MECLIZINE 25 MG TABLET: 25 | 15 days supply | Qty: 30 | Fill #0

## 2019-02-21 MED FILL — BENZONATATE 100 MG CAP: 100 | 10 days supply | Qty: 30 | Fill #0

## 2019-05-18 ENCOUNTER — Other Ambulatory Visit: Payer: Self-pay | Admitting: Internal Medicine

## 2019-05-18 DIAGNOSIS — Z9289 Personal history of other medical treatment: Secondary | ICD-10-CM

## 2019-05-24 DIAGNOSIS — Z1211 Encounter for screening for malignant neoplasm of colon: Secondary | ICD-10-CM | POA: Diagnosis not present

## 2019-05-24 DIAGNOSIS — Z124 Encounter for screening for malignant neoplasm of cervix: Secondary | ICD-10-CM | POA: Diagnosis not present

## 2019-05-24 DIAGNOSIS — Z1239 Encounter for other screening for malignant neoplasm of breast: Secondary | ICD-10-CM | POA: Diagnosis not present

## 2019-05-24 DIAGNOSIS — Z0000 Encounter for general adult medical examination without abnormal findings: Secondary | ICD-10-CM | POA: Diagnosis not present

## 2019-06-29 ENCOUNTER — Other Ambulatory Visit: Payer: Self-pay

## 2019-06-29 ENCOUNTER — Ambulatory Visit
Admission: RE | Admit: 2019-06-29 | Discharge: 2019-06-29 | Disposition: A | Discharge status: Home / Self Care | Payer: 59 | Source: Ambulatory Visit | Attending: Internal Medicine | Admitting: Internal Medicine

## 2019-06-29 DIAGNOSIS — Z9289 Personal history of other medical treatment: Secondary | ICD-10-CM

## 2019-06-29 DIAGNOSIS — Z1231 Encounter for screening mammogram for malignant neoplasm of breast: Secondary | ICD-10-CM | POA: Diagnosis not present

## 2019-08-19 ENCOUNTER — Encounter (HOSPITAL_COMMUNITY): Payer: Self-pay

## 2019-08-19 ENCOUNTER — Other Ambulatory Visit: Payer: Self-pay

## 2019-08-19 ENCOUNTER — Ambulatory Visit (HOSPITAL_COMMUNITY)
Admission: EM | Admit: 2019-08-19 | Discharge: 2019-08-19 | Disposition: A | Discharge status: Home / Self Care | Payer: 59 | Attending: Physician Assistant | Admitting: Physician Assistant

## 2019-08-19 DIAGNOSIS — L509 Urticaria, unspecified: Secondary | ICD-10-CM

## 2019-08-19 DIAGNOSIS — L03116 Cellulitis of left lower limb: Secondary | ICD-10-CM

## 2019-08-19 MED ORDER — SULFAMETHOXAZOLE-TRIMETHOPRIM 800-160 MG PO TABS: 1.0000 | ORAL_TABLET | Freq: Two times a day (BID) | ORAL | 0 refills | Status: DC

## 2019-08-19 MED ORDER — PREDNISONE 10 MG (21) PO TBPK: ORAL_TABLET | ORAL | 0 refills | Status: DC

## 2019-08-19 NOTE — ED Provider Notes (Signed)
[facility]    CSN: [id number] Arrival date & time: 08/19/19  1656      History   Chief Complaint Chief Complaint  Patient presents with  . Rash    HPI Christy Martinez is a 53 y.o. female.   Patient here concerned with rash on face and "cellulitis" L lower leg.  Notes rash on face and body started 2 days ago.  Admits itchiness, urticaria, denies swelling lips and tongue, wheezing, coughing, trouble breathing. She is taking allegra without relief. Reports she is allergic to dust.  She reports rash on leg started 2 weeks ago, denies trauma.  She tried using clotrimazole cream without relief.  Admits pain, tenderness, swelling, erythema, yellow/clear discharge.     History reviewed. No pertinent past medical history.  Patient Active Problem List   Diagnosis Date Noted  . 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 Breast density 02/27/2014  . Sciatica 09/23/2012  . Joint pain 10/16/2011  . Screening 10/16/2011    History reviewed. No pertinent surgical history.  OB History    Gravida  2   Para  2   Term      Preterm      AB      Living        SAB      TAB      Ectopic      Multiple      Live Births               Home Medications    Prior to Admission medications   Medication Sig Start Date End Date Taking? Authorizing Provider  acetaminophen (TYLENOL) 500 MG tablet Take 500 mg by mouth every 6 (six) hours as needed.    [provider]  Calcium Carbonate-Vitamin D (CALTRATE 600+D) 600-400 MG-UNIT per tablet Take 1 tablet by mouth daily.      [provider]  Cyanocobalamin (VITAMIN B-12 CR PO) Take 1 tablet by mouth daily.      [provider]  predniSONE (STERAPRED UNI-PAK 21 TAB) 10 MG (21) TBPK tablet Follow direction on package 08/19/19   Evern CoreLindquist, Danuta Huseman, PA-C  sulfamethoxazole-trimethoprim (BACTRIM DS) 800-160 MG tablet Take 1 tablet by mouth 2 (two) times daily. 08/19/19   Evern CoreLindquist, Gesselle Fitzsimons, PA-C     Family History Family History  Problem Relation Age of Onset  . Diabetes Father   . Hypertension Father     Social History Social History   Tobacco Use  . Smoking status: Never Smoker  . Smokeless tobacco: Never Used  Substance Use Topics  . Alcohol use: No  . Drug use: No     Allergies   Patient has no known allergies.   Review of Systems Review of Systems  Constitutional: Negative for chills and fever.  HENT: Negative for congestion, drooling, facial swelling, rhinorrhea, sneezing, sore throat and trouble swallowing.   Eyes: Negative for discharge, redness and itching.  Respiratory: Negative for cough, choking, shortness of breath and wheezing.   Cardiovascular: Negative for chest pain and palpitations.  Gastrointestinal: Negative for abdominal pain, diarrhea, nausea and vomiting.  Musculoskeletal: Positive for myalgias. Negative for arthralgias.  Skin: Positive for color change, rash and wound.  Allergic/Immunologic: Negative for immunocompromised state.  Neurological: Negative for dizziness, weakness, light-headedness, numbness and headaches.  Hematological: Negative for adenopathy. Does not bruise/bleed easily.  Psychiatric/Behavioral: Negative for decreased concentration and sleep disturbance.     Physical Exam Triage Vital Signs ED Triage Vitals  Enc  Vitals Group     BP 08/19/19 1737 (!) 118/54     Pulse Rate 08/19/19 1737 82     Resp 08/19/19 1737 17     Temp 08/19/19 1737 97.9 F (36.6 C)     Temp Source 08/19/19 1737 Oral     SpO2 08/19/19 1737 99 %     Weight --      Height --      Head Circumference --      Peak Flow --      Pain Score 08/19/19 1735 0     Pain Loc --      Pain Edu? --      Excl. in GC? --    No data found.  Updated Vital Signs BP (!) 118/54 (BP Location: Left Arm)   Pulse 82   Temp 97.9 F (36.6 C) (Oral)   Resp 17   SpO2 99%   Visual Acuity Right Eye Distance:   Left Eye Distance:   Bilateral Distance:     Right Eye Near:   Left Eye Near:    Bilateral Near:     Physical Exam Vitals signs and nursing note reviewed.  Constitutional:      General: She is not in acute distress.    Appearance: She is well-developed and normal weight. She is not ill-appearing or toxic-appearing.  HENT:     Head: Normocephalic and atraumatic.     Nose: No congestion or rhinorrhea.     Mouth/Throat:     Mouth: Mucous membranes are moist.     Tongue: No lesions. Tongue does not deviate from midline.     Pharynx: Oropharynx is clear. No oropharyngeal exudate or posterior oropharyngeal erythema.     Comments: No swelling of tongue/lips or pharynx noted Eyes:     General: No scleral icterus.    Conjunctiva/sclera: Conjunctivae normal.  Neck:     Musculoskeletal: Neck supple.  Cardiovascular:     Rate and Rhythm: Normal rate and regular rhythm.     Heart sounds: No murmur.  Pulmonary:     Effort: Pulmonary effort is normal. No respiratory distress.     Breath sounds: Normal breath sounds.  Abdominal:     Palpations: Abdomen is soft.     Tenderness: There is no abdominal tenderness.  Skin:    General: Skin is warm and dry.     Capillary Refill: Capillary refill takes less than 2 seconds.     Findings: Rash (urticalria rash on face, arms b/l, legs b/l) and wound (4 cm open wound lateral aspect L lower leg, weeping serous fluid, +induration  +tenderness) present.  Neurological:     General: No focal deficit present.     Mental Status: She is alert and oriented to person, place, and time.  Psychiatric:        Mood and Affect: Mood normal.        Behavior: Behavior normal.      UC Treatments / Results  Labs (all labs ordered are listed, but only abnormal results are displayed) Labs Reviewed - No data to display  EKG   Radiology No results found.  Procedures Procedures (including critical care time)  Medications Ordered in UC Medications - No data to display  Initial Impression / Assessment  and Plan / UC Course  I have reviewed the triage vital signs and the nursing notes.  Pertinent labs & imaging results that were available during my care of the patient were reviewed by me and considered  in my medical decision making (see chart for details).      Final Clinical Impressions(s) / UC Diagnoses   Final diagnoses:  Hives  Cellulitis of left lower extremity     Discharge Instructions     Take medication as prescribed. Keep dermatology appointment on Wednesday    ED Prescriptions    Medication Sig Dispense Auth. Provider   sulfamethoxazole-trimethoprim (BACTRIM DS) 800-160 MG tablet Take 1 tablet by mouth 2 (two) times daily. 20 tablet Peri Jefferson, PA-C   predniSONE (STERAPRED UNI-PAK 21 TAB) 10 MG (21) TBPK tablet Follow direction on package 21 tablet Peri Jefferson, PA-C     PDMP not reviewed this encounter.   Peri Jefferson, PA-C 08/19/19 1759

## 2019-08-19 NOTE — Discharge Instructions (Signed)
Take medication as prescribed. Keep dermatology appointment on Wednesday

## 2019-08-19 NOTE — ED Triage Notes (Signed)
Patient presents to Urgent Care with complaints of rash on face, arms, and back since two nights ago. Patient reports she recently traveled to [place] and thinks she is having an allergic reaction to something. Rash is worst on face, is very itchy, pt took allegra with minimal improvement.

## 2019-08-24 DIAGNOSIS — L258 Unspecified contact dermatitis due to other agents: Secondary | ICD-10-CM | POA: Diagnosis not present

## 2019-08-24 MED FILL — CLOBETASOL PROPIONATE 0.05: 0.05 | 20 days supply | Qty: 60 | Fill #0

## 2019-08-24 MED FILL — FLUTICASONE PROP 0.05% CRM: 0.05 | 30 days supply | Qty: 60 | Fill #0

## 2019-08-31 DIAGNOSIS — L410 Pityriasis lichenoides et varioliformis acuta: Secondary | ICD-10-CM | POA: Diagnosis not present

## 2019-09-01 MED FILL — CLOBETASOL PROPIONATE 0.05: 0.05 | 10 days supply | Qty: 60 | Fill #1

## 2019-09-21 ENCOUNTER — Ambulatory Visit: Payer: Self-pay | Admitting: Allergy

## 2020-02-23 NOTE — Progress Notes (Signed)
New Patient Note  RE: Christy Martinez MRN: [id number] DOB: Jun 06, 1966 Date of Office Visit: 02/24/2020  Referring provider: No ref. provider found Primary care provider: Lorenda Ishihara, MD  Chief Complaint: Rash  History of Present Illness: I had the pleasure of seeing Christy Martinez for initial evaluation at the [facility] on 02/24/2020. She is a 54 y.o. female, who is self-referred here for the evaluation of rash.  Rash:  Rash started in October 2020 while she was on vacation. The rash started on her arms and then spread to her face. She took Claritin with no benefit.  She went to urgent care and gave her prednisone. The rash resolved completely after a few days but then returned. Reviewed images on the phone consistent with urticarial rash on the face.   She then had a new type of rash throughout her body which looks pinpoint erythematous rash.   She was seen by dermatology and was prescribed fluticasone for the face and clobetasol for the body with some benefit but it was causing some hyperpigmentation so she stopped. No more additional rashes but taking a long time for this patches to resolve.  No prior biopsy. No one else had this rash.   Associated symptoms include: pruritis. Suspected triggers are unknown. Denies any fevers, chills, changes in medications, foods, personal care products or recent infections. No COVID-19 diagnosis. She has tried the following therapies: topical steroid creams and topical antihistamines with minimal benefit. Systemic steroids yes.  Previous work up includes: none. Previous history of rash/hives: 6-7 years ago had similar rash when she was gardening. No triggers noted. Eventually subsided.  Patient is up to date with the following cancer screening tests: mammogram. Not up to date with colonoscopy.   Currently using tide, dove soap, Eucerin.   Assessment and Plan: Christy Martinez is a 54 y.o. female with: Pruritic rash Pruritic  rash started in October 2020 while on vacation. Initially had raised papular erythematous rash on arm which spread to face. Resolved with prednisone then a petechial erythematous rash appeared on legs. Seen by dermatology and used fluticasone and clobetasol topical creams which caused hyperpigmentation. They are slowly improving but extremely pruritic. No previous skin biopsy or patch testing. 1 prior history of breaking out in rash while gardening 6-7 years ago.  Denies any changes in diet, medication or personal care products.  No one else had this type of rash on the vacation.  Discussed with patient that not sure what caused the rash but looks like it's healing.  There is no indication for any type of environmental or food testing that's needed at this time.  Take hydroxyzine 25mg  1 hour before bedtime as needed for itching.  Take zyrtec (cetirizine) 10mg  in the morning.   This replaces all your other antihistamines.  Get bloodwork.    See below for proper skin care.  Schedule for patch testing.  Take pictures if it gets worse.   If work up comes back normal and rash still persistent then may need biopsy by dermatology for further evaluation.   Return for Patch testing.  Meds ordered this encounter  Medications  . hydrOXYzine (ATARAX/VISTARIL) 25 MG tablet    Sig: Take 1 tablet (25 mg total) by mouth at bedtime as needed for itching.    Dispense:  30 tablet    Refill:  2    Lab Orders     CBC with Differential/Platelet     Comprehensive metabolic panel  Chronic Urticaria     Sedimentation rate     Tryptase     ANA w/Reflex     C-reactive protein     Thyroid Cascade Profile  Other allergy screening: Asthma: no Rhino conjunctivitis: no Food allergy: no Medication allergy: no Hymenoptera allergy: no History of recurrent infections suggestive of immunodeficency: no  Diagnostics: None.   Past Medical History: Patient Active Problem List   Diagnosis Date Noted    . Pruritic rash 02/24/2020  . 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 Breast density 02/27/2014  . Sciatica 09/23/2012  . Joint pain 10/16/2011  . Screening 10/16/2011   History reviewed. No pertinent past medical history. Past Surgical History: History reviewed. No pertinent surgical history. Medication List:  Current Outpatient Medications  Medication Sig Dispense Refill  . acetaminophen (TYLENOL) 500 MG tablet Take 500 mg by mouth every 6 (six) hours as needed.    . Ascorbic Acid (VITAMIN C) 1000 MG tablet Take 1,000 mg by mouth daily.    . Calcium Carbonate-Vitamin D (CALTRATE 600+D) 600-400 MG-UNIT per tablet Take 1 tablet by mouth daily.      . Cyanocobalamin (VITAMIN B-12 CR PO) Take 1 tablet by mouth daily.      Marland Kitchen glucosamine-chondroitin 500-400 MG tablet Take 1 tablet by mouth 2 (two) times daily.    Marland Kitchen levocetirizine (XYZAL) 5 MG tablet Take 5 mg by mouth every evening.    . loratadine (CLARITIN) 10 MG tablet Take 10 mg by mouth daily.    . Turmeric 500 MG CAPS Take by mouth.    . hydrOXYzine (ATARAX/VISTARIL) 25 MG tablet Take 1 tablet (25 mg total) by mouth at bedtime as needed for itching. 30 tablet 2   No current facility-administered medications for this visit.   Allergies: No Known Allergies Social History: Social History   Socioeconomic History  . Marital status: Married    Spouse name: Not on file  . Number of children: Not on file  . Years of education: Not on file  . Highest education level: Not on file  Occupational History  . Not on file  Tobacco Use  . Smoking status: Never Smoker  . Smokeless tobacco: Never Used  Substance and Sexual Activity  . Alcohol use: No  . Drug use: No  . Sexual activity: Yes    Birth control/protection: Coitus interruptus  Other Topics Concern  . Not on file  Social History Narrative  . Not on file   Social Determinants of Health   Financial Resource Strain:   . Difficulty of Paying Living Expenses:    Food Insecurity:   . Worried About Programme researcher, broadcasting/film/video in the Last Year:   . Barista in the Last Year:   Transportation Needs:   . Freight forwarder (Medical):   Marland Kitchen Lack of Transportation (Non-Medical):   Physical Activity:   . Days of Exercise per Week:   . Minutes of Exercise per Session:   Stress:   . Feeling of Stress :   Social Connections:   . Frequency of Communication with Friends and Family:   . Frequency of Social Gatherings with Friends and Family:   . Attends Religious Services:   . Active Member of Clubs or Organizations:   . Attends Banker Meetings:   Marland Kitchen Marital Status:    Lives in a 53 year old house. Smoking: denies Occupation: Armed forces training and education officer HistorySurveyor, minerals in the house: no Carpet in the family  room: no Carpet in the bedroom: yes Heating: electric Cooling: central Pet: yes 1 dog x 10 yrs  Family History: Family History  Problem Relation Age of Onset  . Diabetes Father   . Hypertension Father    Problem                               Relation Asthma                                   No  Eczema                                No  Food allergy                          no Allergic rhino conjunctivitis     No   Review of Systems  Constitutional: Negative for appetite change, chills, fever and unexpected weight change.  HENT: Negative for congestion and rhinorrhea.   Eyes: Negative for itching.  Respiratory: Negative for cough, chest tightness, shortness of breath and wheezing.   Cardiovascular: Negative for chest pain.  Gastrointestinal: Negative for abdominal pain.  Genitourinary: Negative for difficulty urinating.  Skin: Positive for rash.  Allergic/Immunologic: Negative for environmental allergies and food allergies.  Neurological: Negative for headaches.   Objective: BP 104/66   Pulse 69   Temp (!) 96.8 F (36 C) (Temporal)   Resp 18   Ht 5' 2.5" (1.588 m)   Wt 132 lb 12.8 oz (60.2 kg)   SpO2 100%    BMI 23.90 kg/m  Body mass index is 23.9 kg/m. Physical Exam  Constitutional: She is oriented to person, place, and time. She appears well-developed and well-nourished.  HENT:  Head: Normocephalic and atraumatic.  Right Ear: External ear normal.  Left Ear: External ear normal.  Nose: Nose normal.  Mouth/Throat: Oropharynx is clear and moist.  Eyes: Conjunctivae and EOM are normal.  Cardiovascular: Normal rate, regular rhythm and normal heart sounds. Exam reveals no gallop and no friction rub.  No murmur heard. Pulmonary/Chest: Effort normal and breath sounds normal. She has no wheezes. She has no rales.  Abdominal: Soft.  Musculoskeletal:     Cervical back: Neck supple.  Neurological: She is alert and oriented to person, place, and time.  Skin: Skin is warm. Rash noted.  Scattered hyperpigmented circular patches with areas of dryness throughout the lower extremities b/l and the back.   Psychiatric: She has a normal mood and affect. Her behavior is normal.  Nursing note and vitals reviewed.  The plan was reviewed with the patient/family, and all questions/concerned were addressed.  It was my pleasure to see Christy Martinez today and participate in her care. Please feel free to contact me with any questions or concerns.  Sincerely,  Rexene Alberts, DO Allergy & Immunology  [facility] office: [phone] [facility] office: [place] office: [phone]

## 2020-02-24 ENCOUNTER — Encounter: Payer: Self-pay | Admitting: Allergy

## 2020-02-24 ENCOUNTER — Ambulatory Visit (INDEPENDENT_AMBULATORY_CARE_PROVIDER_SITE_OTHER): Payer: 59 | Admitting: Allergy

## 2020-02-24 ENCOUNTER — Other Ambulatory Visit: Payer: Self-pay

## 2020-02-24 VITALS — BP 104/66 | HR 69 | Temp 96.8°F | Resp 18 | Ht 62.5 in | Wt 132.8 lb

## 2020-02-24 DIAGNOSIS — L282 Other prurigo: Secondary | ICD-10-CM | POA: Diagnosis not present

## 2020-02-24 MED ORDER — HYDROXYZINE HCL 25 MG PO TABS: 25.0000 mg | ORAL_TABLET | Freq: Every evening | ORAL | 2 refills | Status: AC | PRN

## 2020-02-24 MED FILL — hydrOXYzine HCL 25 MG TABS: 25 | 30 days supply | Qty: 30 | Fill #0

## 2020-02-24 NOTE — Patient Instructions (Addendum)
Rash:  Not sure what caused the rash but looks like it's healing.  Take hydroxyzine 25mg  1 hour before bedtime as needed for itching.  Take zyrtec (cetirizine) 10mg  in the morning.   This replaces all your other antihistamines.  Get bloodwork:  We are ordering labs, so please allow 1-2 weeks for the results to come back. With the newly implemented Cures Act, the labs might be visible to you at the same time that they become visible to me. However, I will not address the results until all of the results are back, so please be patient.    See below for proper skin care.  Schedule for patch testing.  Take pictures if it gets worse.    Patches are best placed on Monday with return to office on Wednesday and Friday of same week for readings.  Patches once placed should not get wet.  You do not have to stop any medications for patch testing but should not be on oral prednisone. You can schedule a patch testing visit when convenient for your schedule.    Follow up 1-2 months after patch testing for regular office visit.    Skin care recommendations  Bath time: . Always use lukewarm water. AVOID very hot or cold water. Friday Keep bathing time to 5-10 minutes. . Do NOT use bubble bath. . Use a mild soap and use just enough to wash the dirty areas. . Do NOT scrub skin vigorously.  . After bathing, pat dry your skin with a towel. Do NOT rub or scrub the skin.  Moisturizers and prescriptions:  . ALWAYS apply moisturizers immediately after bathing (within 3 minutes). This helps to lock-in moisture. . Use the moisturizer several times a day over the whole body. Thursday summer moisturizers include: Aveeno, CeraVe, Cetaphil. Marland Kitchen winter moisturizers include: Aquaphor, Vaseline, Cerave, Cetaphil, Eucerin, Vanicream. . When using moisturizers along with medications, the moisturizer should be applied about one hour after applying the medication to prevent diluting effect of the medication or  moisturize around where you applied the medications. When not using medications, the moisturizer can be continued twice daily as maintenance.  Laundry and clothing: . Avoid laundry products with added color or perfumes. . Use unscented hypo-allergenic laundry products such as Tide free, Cheer free & gentle, and All free and clear.  . If the skin still seems dry or sensitive, you can try double-rinsing the clothes. . Avoid tight or scratchy clothing such as wool. . Do not use fabric softeners or dyer sheets.  True Test looks for the following sensitivities:

## 2020-02-24 NOTE — Assessment & Plan Note (Signed)
Pruritic rash started in October 2020 while on vacation. Initially had raised papular erythematous rash on arm which spread to face. Resolved with prednisone then a petechial erythematous rash appeared on legs. Seen by dermatology and used fluticasone and clobetasol topical creams which caused hyperpigmentation. They are slowly improving but extremely pruritic. No previous skin biopsy or patch testing. 1 prior history of breaking out in rash while gardening 6-7 years ago.  Denies any changes in diet, medication or personal care products.  No one else had this type of rash on the vacation.  Discussed with patient that not sure what caused the rash but looks like it's healing.  There is no indication for any type of environmental or food testing that's needed at this time.  Take hydroxyzine 25mg  1 hour before bedtime as needed for itching.  Take zyrtec (cetirizine) 10mg  in the morning.   This replaces all your other antihistamines.  Get bloodwork.    See below for proper skin care.  Schedule for patch testing.  Take pictures if it gets worse.   If work up comes back normal and rash still persistent then may need biopsy by dermatology for further evaluation.

## 2020-03-02 LAB — CBC WITH DIFFERENTIAL/PLATELET
Basophils Absolute: 0.1 10*3/uL (ref 0.0–0.2)
Basos: 1 %
EOS (ABSOLUTE): 0.2 10*3/uL (ref 0.0–0.4)
Eos: 4 %
Hematocrit: 42.4 % (ref 34.0–46.6)
Hemoglobin: 14.2 g/dL (ref 11.1–15.9)
Immature Grans (Abs): 0 10*3/uL (ref 0.0–0.1)
Immature Granulocytes: 0 %
Lymphocytes Absolute: 1.6 10*3/uL (ref 0.7–3.1)
Lymphs: 40 %
MCH: 32.4 pg (ref 26.6–33.0)
MCHC: 33.5 g/dL (ref 31.5–35.7)
MCV: 97 fL (ref 79–97)
Monocytes Absolute: 0.3 10*3/uL (ref 0.1–0.9)
Monocytes: 7 %
Neutrophils Absolute: 1.9 10*3/uL (ref 1.4–7.0)
Neutrophils: 48 %
Platelets: 244 10*3/uL (ref 150–450)
RBC: 4.38 x10E6/uL (ref 3.77–5.28)
RDW: 12.3 % (ref 11.7–15.4)
WBC: 3.9 10*3/uL (ref 3.4–10.8)

## 2020-03-02 LAB — COMPREHENSIVE METABOLIC PANEL
ALT: 12 IU/L (ref 0–32)
AST: 19 IU/L (ref 0–40)
Albumin/Globulin Ratio: 1.6 (ref 1.2–2.2)
Albumin: 4.7 g/dL (ref 3.8–4.9)
Alkaline Phosphatase: 67 IU/L (ref 39–117)
BUN/Creatinine Ratio: 16 (ref 9–23)
BUN: 10 mg/dL (ref 6–24)
Bilirubin Total: 0.4 mg/dL (ref 0.0–1.2)
CO2: 26 mmol/L (ref 20–29)
Calcium: 9.5 mg/dL (ref 8.7–10.2)
Chloride: 103 mmol/L (ref 96–106)
Creatinine, Ser: 0.62 mg/dL (ref 0.57–1.00)
GFR calc Af Amer: 120 mL/min/{1.73_m2} (ref >59)
GFR calc non Af Amer: 104 mL/min/{1.73_m2} (ref >59)
Globulin, Total: 2.9 g/dL (ref 1.5–4.5)
Glucose: 89 mg/dL (ref 65–99)
Potassium: 3.9 mmol/L (ref 3.5–5.2)
Sodium: 141 mmol/L (ref 134–144)
Total Protein: 7.6 g/dL (ref 6.0–8.5)

## 2020-03-02 LAB — SEDIMENTATION RATE: Sed Rate: 18 mm/hr (ref 0–40)

## 2020-03-02 LAB — CHRONIC URTICARIA: cu index: 3 (ref <10)

## 2020-03-02 LAB — TRYPTASE: Tryptase: 3.7 ug/L (ref 2.2–13.2)

## 2020-03-02 LAB — THYROID CASCADE PROFILE: TSH: 1.14 u[IU]/mL (ref 0.450–4.500)

## 2020-03-02 LAB — ANA W/REFLEX: Anti Nuclear Antibody (ANA): NEGATIVE

## 2020-03-02 LAB — C-REACTIVE PROTEIN: CRP: 1 mg/L (ref 0–10)

## 2020-03-07 ENCOUNTER — Ambulatory Visit: Payer: Self-pay | Admitting: Allergy

## 2020-06-01 DIAGNOSIS — Z1239 Encounter for other screening for malignant neoplasm of breast: Secondary | ICD-10-CM | POA: Diagnosis not present

## 2020-06-01 DIAGNOSIS — Z13220 Encounter for screening for lipoid disorders: Secondary | ICD-10-CM | POA: Diagnosis not present

## 2020-06-01 DIAGNOSIS — E559 Vitamin D deficiency, unspecified: Secondary | ICD-10-CM | POA: Diagnosis not present

## 2020-06-01 DIAGNOSIS — L209 Atopic dermatitis, unspecified: Secondary | ICD-10-CM | POA: Diagnosis not present

## 2020-06-01 DIAGNOSIS — Z833 Family history of diabetes mellitus: Secondary | ICD-10-CM | POA: Diagnosis not present

## 2020-06-01 DIAGNOSIS — Z0000 Encounter for general adult medical examination without abnormal findings: Secondary | ICD-10-CM | POA: Diagnosis not present

## 2020-06-01 DIAGNOSIS — Z1211 Encounter for screening for malignant neoplasm of colon: Secondary | ICD-10-CM | POA: Diagnosis not present

## 2020-06-01 DIAGNOSIS — Z23 Encounter for immunization: Secondary | ICD-10-CM | POA: Diagnosis not present

## 2020-06-01 DIAGNOSIS — M5430 Sciatica, unspecified side: Secondary | ICD-10-CM | POA: Diagnosis not present

## 2020-06-01 DIAGNOSIS — N951 Menopausal and female climacteric states: Secondary | ICD-10-CM | POA: Diagnosis not present

## 2020-06-01 DIAGNOSIS — G5601 Carpal tunnel syndrome, right upper limb: Secondary | ICD-10-CM | POA: Diagnosis not present

## 2020-06-06 ENCOUNTER — Other Ambulatory Visit: Payer: Self-pay | Admitting: Internal Medicine

## 2020-06-06 DIAGNOSIS — Z1231 Encounter for screening mammogram for malignant neoplasm of breast: Secondary | ICD-10-CM

## 2020-06-28 ENCOUNTER — Ambulatory Visit: Payer: 59

## 2020-07-04 ENCOUNTER — Ambulatory Visit
Admission: RE | Admit: 2020-07-04 | Discharge: 2020-07-04 | Disposition: A | Discharge status: Home / Self Care | Payer: 59 | Source: Ambulatory Visit | Attending: Internal Medicine | Admitting: Internal Medicine

## 2020-07-04 ENCOUNTER — Other Ambulatory Visit: Payer: Self-pay

## 2020-07-04 DIAGNOSIS — Z1231 Encounter for screening mammogram for malignant neoplasm of breast: Secondary | ICD-10-CM | POA: Diagnosis not present

## 2020-07-04 DIAGNOSIS — Z111 Encounter for screening for respiratory tuberculosis: Secondary | ICD-10-CM | POA: Diagnosis not present

## 2020-07-04 DIAGNOSIS — Z124 Encounter for screening for malignant neoplasm of cervix: Secondary | ICD-10-CM | POA: Diagnosis not present

## 2020-07-04 DIAGNOSIS — Z1211 Encounter for screening for malignant neoplasm of colon: Secondary | ICD-10-CM | POA: Diagnosis not present

## 2020-07-17 DIAGNOSIS — Z20822 Contact with and (suspected) exposure to covid-19: Secondary | ICD-10-CM | POA: Diagnosis not present

## 2020-07-17 DIAGNOSIS — Z03818 Encounter for observation for suspected exposure to other biological agents ruled out: Secondary | ICD-10-CM | POA: Diagnosis not present

## 2020-08-20 DIAGNOSIS — Z1211 Encounter for screening for malignant neoplasm of colon: Secondary | ICD-10-CM | POA: Diagnosis not present

## 2020-08-21 DIAGNOSIS — Z1211 Encounter for screening for malignant neoplasm of colon: Secondary | ICD-10-CM | POA: Diagnosis not present

## 2020-09-10 ENCOUNTER — Other Ambulatory Visit (HOSPITAL_BASED_OUTPATIENT_CLINIC_OR_DEPARTMENT_OTHER): Payer: Self-pay | Admitting: Internal Medicine

## 2020-09-10 ENCOUNTER — Ambulatory Visit: Payer: 59 | Attending: Internal Medicine

## 2020-09-10 DIAGNOSIS — Z23 Encounter for immunization: Secondary | ICD-10-CM

## 2020-09-10 NOTE — Progress Notes (Signed)
° °  Covid-19 Vaccination Clinic  Name:  Christy Martinez    MRN: [id number] DOB: [birth year]  09/10/2020  Christy Martinez was observed post Covid-19 immunization for 15 minutes without incident. She was provided with Vaccine Information Sheet and instruction to access the V-Safe system.   Christy Martinez was instructed to call 911 with any severe reactions post vaccine:  Difficulty breathing   Swelling of face and throat   A fast heartbeat   A bad rash all over body   Dizziness and weakness

## 2020-09-14 MED FILL — PFIZER-BIONTECH COVID-19 VA: 30 | 1 days supply | Qty: 0 | Fill #0

## 2020-10-12 DIAGNOSIS — Z03818 Encounter for observation for suspected exposure to other biological agents ruled out: Secondary | ICD-10-CM | POA: Diagnosis not present

## 2020-10-12 DIAGNOSIS — Z20822 Contact with and (suspected) exposure to covid-19: Secondary | ICD-10-CM | POA: Diagnosis not present

## 2021-01-01 ENCOUNTER — Other Ambulatory Visit: Payer: Self-pay | Admitting: Internal Medicine

## 2021-01-01 DIAGNOSIS — Z1231 Encounter for screening mammogram for malignant neoplasm of breast: Secondary | ICD-10-CM

## 2021-07-05 ENCOUNTER — Other Ambulatory Visit: Payer: Self-pay

## 2021-07-05 ENCOUNTER — Ambulatory Visit
Admission: RE | Admit: 2021-07-05 | Discharge: 2021-07-05 | Disposition: A | Discharge status: Home / Self Care | Payer: 59 | Source: Ambulatory Visit | Attending: Internal Medicine | Admitting: Internal Medicine

## 2021-07-05 DIAGNOSIS — Z1231 Encounter for screening mammogram for malignant neoplasm of breast: Secondary | ICD-10-CM

## 2021-09-13 IMAGING — MG MM DIGITAL SCREENING BILAT W/ TOMO AND CAD
8 series · 9 of 24 positions shown · non-contrast
Comparison: Previous exam(s).

CLINICAL DATA: Screening.

EXAM:
DIGITAL SCREENING BILATERAL MAMMOGRAM WITH TOMOSYNTHESIS AND CAD
TECHNIQUE: Bilateral screening digital craniocaudal and mediolateral oblique
mammograms were obtained. Bilateral screening digital breast
tomosynthesis was performed. The images were evaluated with
computer-aided detection.

[R CC synth-2D]
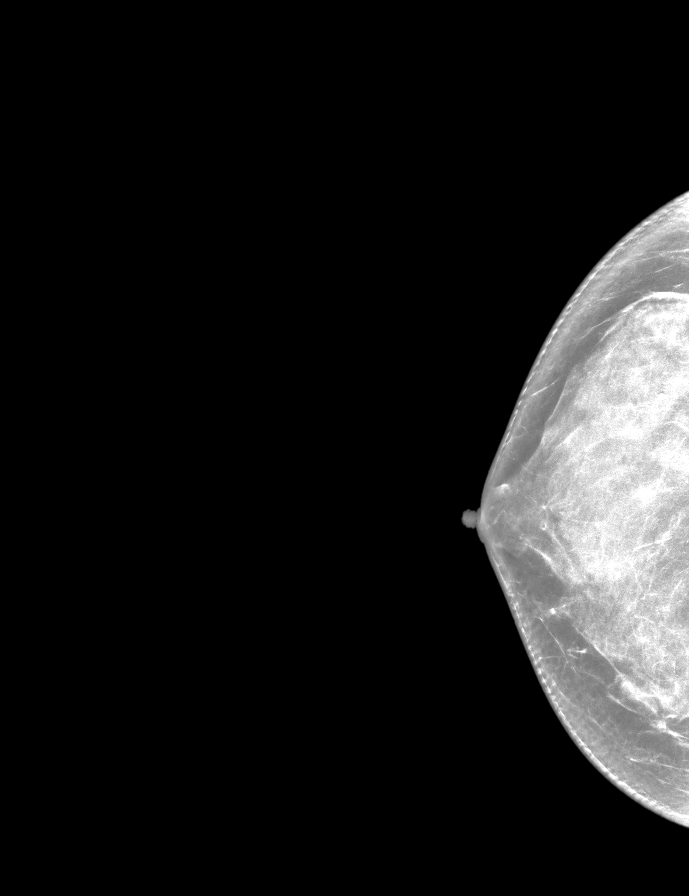

[L MLO synth-2D]
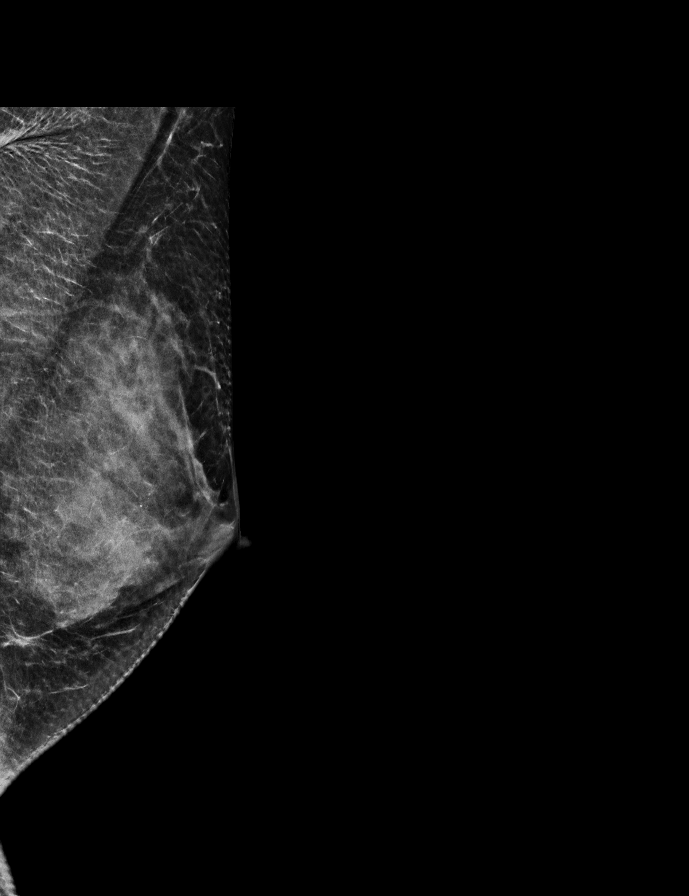

[R MLO synth-2D]
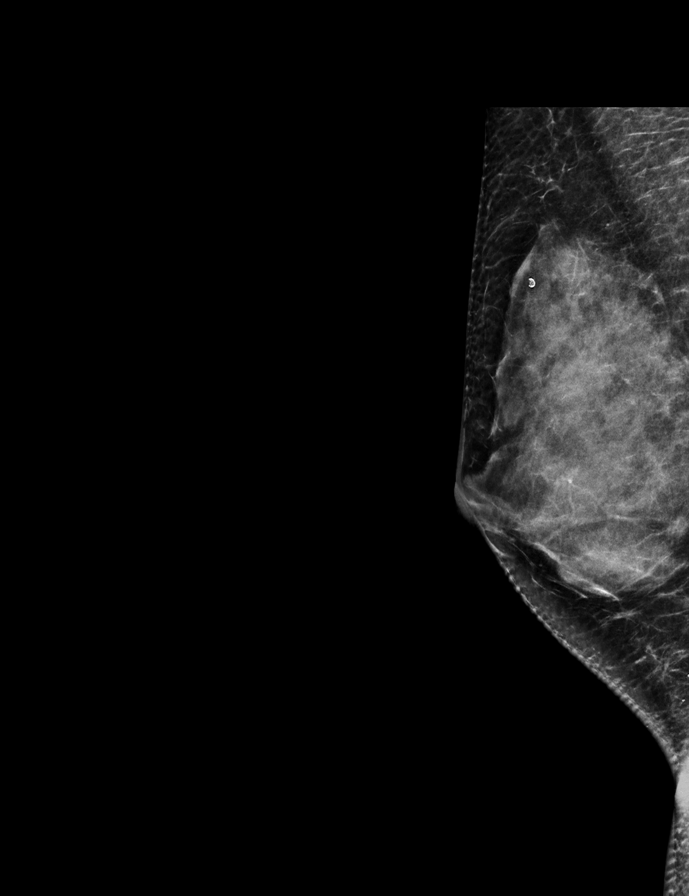

[L CC synth-2D]
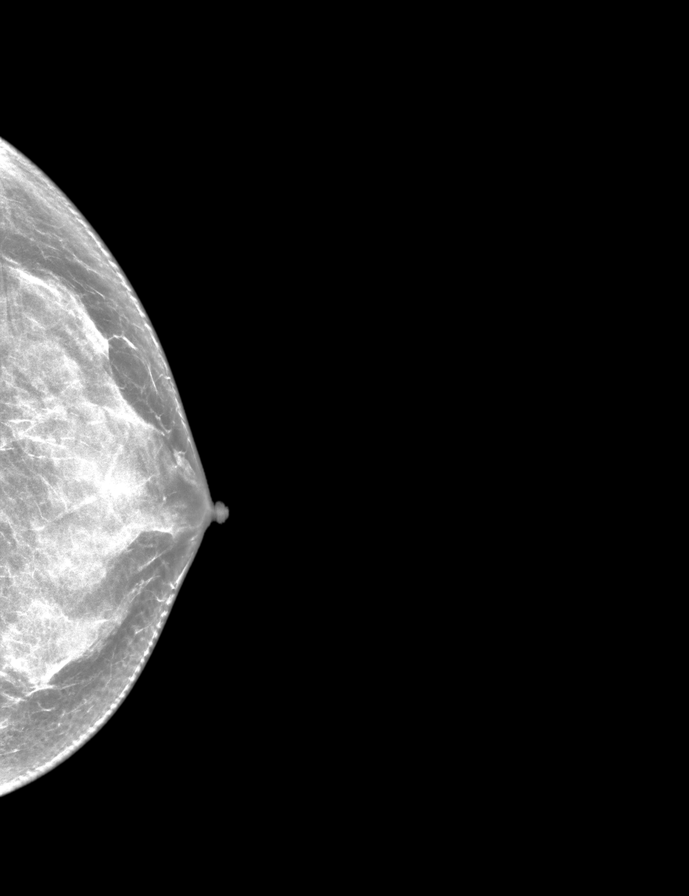

[R MLO tomo · 2 of 62 frames shown]
[frame 21/62]
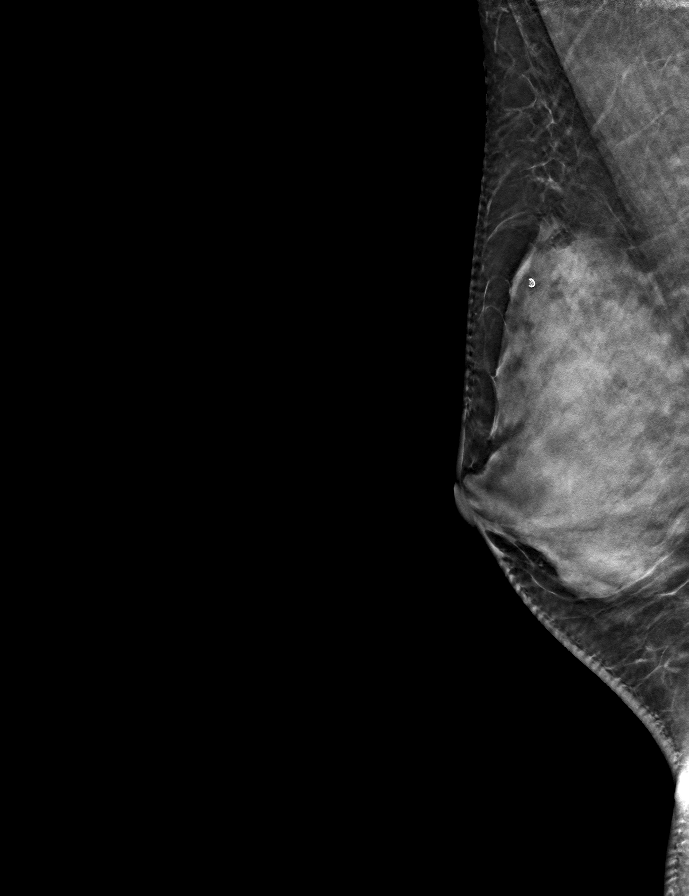
[frame 31/62]
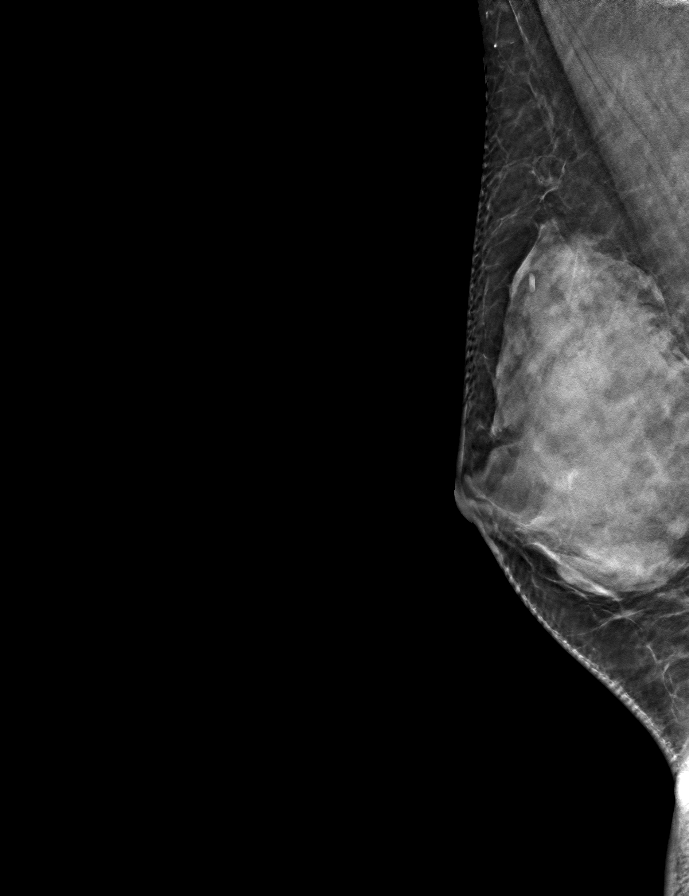

[L CC tomo · tomo slice 30/59.0]
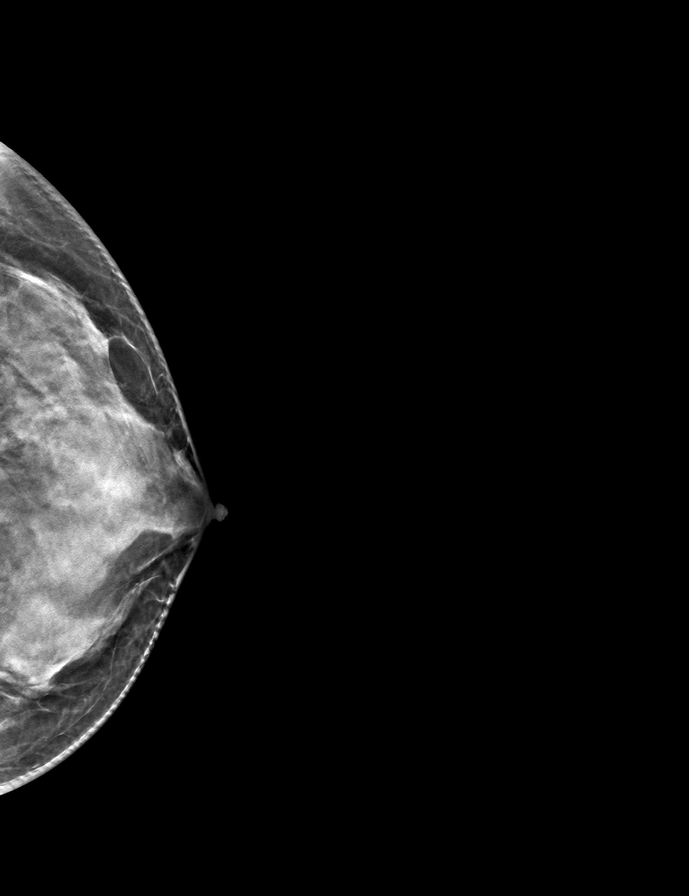

[L MLO tomo · tomo slice 31/61.0]
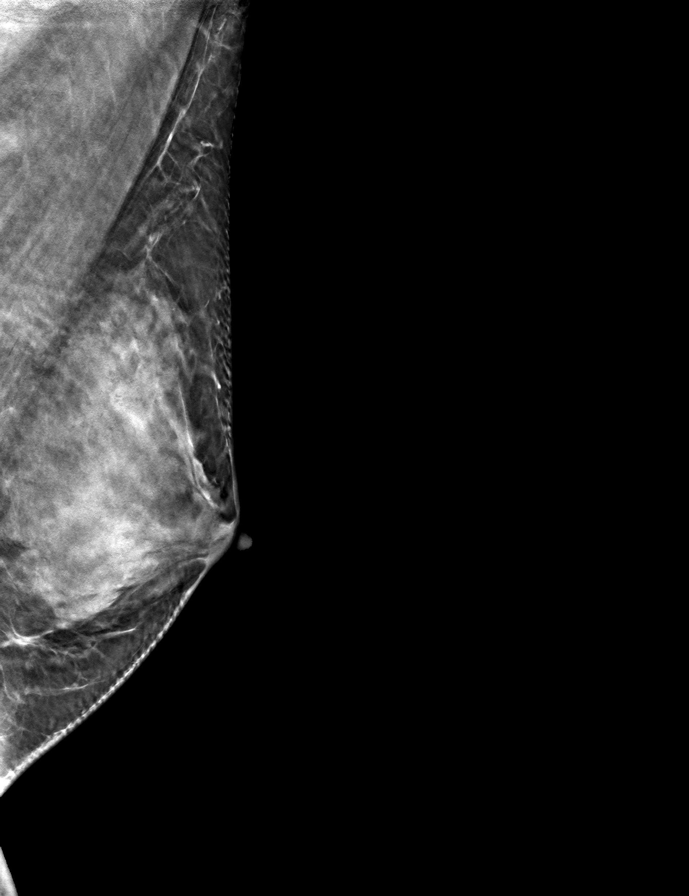

[R CC tomo · tomo slice 32/63.0]
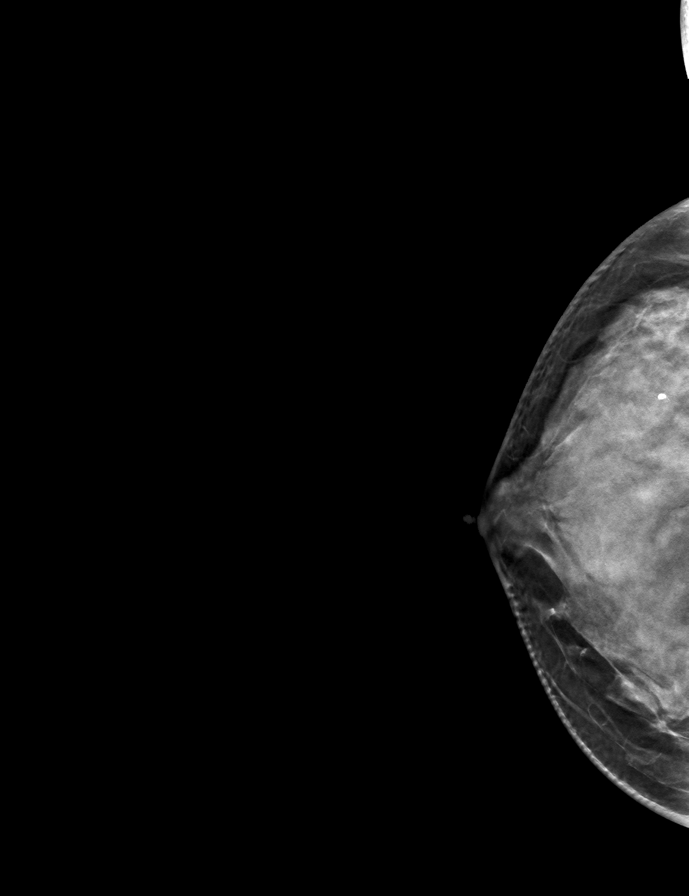

[9 of 24 positions shown; findings below may reference images not displayed]

ACR Breast Density Category d: The breast tissue is extremely dense,
which lowers the sensitivity of mammography
FINDINGS: There are no findings suspicious for malignancy.
IMPRESSION: No mammographic evidence of malignancy. A result letter of this
screening mammogram will be mailed directly to the patient.

RECOMMENDATION:
Screening mammogram in one year. (Code:TA-V-WV9)

BI-RADS CATEGORY  1: Negative.

## 2022-05-02 ENCOUNTER — Other Ambulatory Visit: Payer: Self-pay | Admitting: Internal Medicine

## 2022-05-02 DIAGNOSIS — Z1231 Encounter for screening mammogram for malignant neoplasm of breast: Secondary | ICD-10-CM

## 2022-07-01 ENCOUNTER — Other Ambulatory Visit (HOSPITAL_BASED_OUTPATIENT_CLINIC_OR_DEPARTMENT_OTHER): Payer: Self-pay

## 2022-07-01 MED ORDER — VITAMIN D (ERGOCALCIFEROL) 50000 UNITS PO CAPS
ORAL_CAPSULE | ORAL | 1 refills | Status: DC
  Filled 2022-07-01: qty 4, 28d supply, fill #0
  Filled 2022-07-24: qty 4, 28d supply, fill #1

## 2022-07-11 ENCOUNTER — Ambulatory Visit
Admission: RE | Admit: 2022-07-11 | Discharge: 2022-07-11 | Disposition: A | Discharge status: Home / Self Care | Payer: 59 | Source: Ambulatory Visit | Attending: Internal Medicine | Admitting: Internal Medicine

## 2022-07-11 ENCOUNTER — Ambulatory Visit: Payer: 59

## 2022-07-11 DIAGNOSIS — Z1231 Encounter for screening mammogram for malignant neoplasm of breast: Secondary | ICD-10-CM

## 2022-07-16 ENCOUNTER — Other Ambulatory Visit: Payer: Self-pay | Admitting: Internal Medicine

## 2022-07-16 DIAGNOSIS — R928 Other abnormal and inconclusive findings on diagnostic imaging of breast: Secondary | ICD-10-CM

## 2022-07-24 ENCOUNTER — Other Ambulatory Visit (HOSPITAL_BASED_OUTPATIENT_CLINIC_OR_DEPARTMENT_OTHER): Payer: Self-pay

## 2022-07-28 ENCOUNTER — Ambulatory Visit: Payer: 59

## 2022-07-28 ENCOUNTER — Ambulatory Visit
Admission: RE | Admit: 2022-07-28 | Discharge: 2022-07-28 | Disposition: A | Discharge status: Home / Self Care | Payer: 59 | Source: Ambulatory Visit | Attending: Internal Medicine | Admitting: Internal Medicine

## 2022-07-28 DIAGNOSIS — R928 Other abnormal and inconclusive findings on diagnostic imaging of breast: Secondary | ICD-10-CM

## 2022-09-17 ENCOUNTER — Other Ambulatory Visit (HOSPITAL_BASED_OUTPATIENT_CLINIC_OR_DEPARTMENT_OTHER): Payer: Self-pay

## 2022-09-17 MED ORDER — TRIAMCINOLONE ACETONIDE 0.1 % EX CREA
1.0000 | TOPICAL_CREAM | Freq: Two times a day (BID) | CUTANEOUS | 0 refills | Status: AC
  Filled 2022-09-17 – 2022-10-01 (×2): qty 15, 8d supply, fill #0

## 2022-09-25 ENCOUNTER — Other Ambulatory Visit (HOSPITAL_BASED_OUTPATIENT_CLINIC_OR_DEPARTMENT_OTHER): Payer: Self-pay

## 2022-10-01 ENCOUNTER — Other Ambulatory Visit (HOSPITAL_BASED_OUTPATIENT_CLINIC_OR_DEPARTMENT_OTHER): Payer: Self-pay

## 2023-01-30 ENCOUNTER — Other Ambulatory Visit (HOSPITAL_BASED_OUTPATIENT_CLINIC_OR_DEPARTMENT_OTHER): Payer: Self-pay

## 2023-01-30 ENCOUNTER — Encounter: Payer: Self-pay | Admitting: Family

## 2023-01-30 ENCOUNTER — Ambulatory Visit: Payer: 59 | Admitting: Family

## 2023-01-30 VITALS — BP 114/50 | HR 70 | Temp 97.5°F | Resp 16 | Ht 62.0 in | Wt 131.0 lb

## 2023-01-30 DIAGNOSIS — Z0000 Encounter for general adult medical examination without abnormal findings: Secondary | ICD-10-CM | POA: Diagnosis not present

## 2023-01-30 DIAGNOSIS — L309 Dermatitis, unspecified: Secondary | ICD-10-CM

## 2023-01-30 DIAGNOSIS — E785 Hyperlipidemia, unspecified: Secondary | ICD-10-CM | POA: Diagnosis not present

## 2023-01-30 DIAGNOSIS — E559 Vitamin D deficiency, unspecified: Secondary | ICD-10-CM

## 2023-01-30 LAB — CBC WITH DIFFERENTIAL/PLATELET
Basophils Relative: 0.9 %
Eosinophils Relative: 1.9 %
MCH: 31.1 pg (ref 27.0–33.0)
MCHC: 33.7 g/dL (ref 32.0–36.0)
Monocytes Relative: 8.1 %
Neutro Abs: 2350 cells/uL (ref 1500–7800)
Neutrophils Relative %: 50 %
Platelets: 249 10*3/uL (ref 140–400)
WBC: 4.7 10*3/uL (ref 3.8–10.8)

## 2023-01-30 MED ORDER — BETAMETHASONE VALERATE 0.1 % EX OINT
1.0000 | TOPICAL_OINTMENT | Freq: Two times a day (BID) | CUTANEOUS | 1 refills | Status: AC
  Filled 2023-01-30: qty 30, 15d supply, fill #0

## 2023-01-30 NOTE — Progress Notes (Signed)
Subjective:   By signing my name below, I, [name], attest that this documentation has been prepared under the direction and in the presence of Debbrah Alar, NP. 01/30/2023.   Patient ID: Christy Martinez, female    DOB: 07-15-1966, 57 y.o.   MRN: [id number]  Chief Complaint  Patient presents with   [place]    HPI Patient is in today for establishment of patient care.  Rash:  She complains of an intermittent rash on her left dorsal hand. This seems to be exacerbated with weather changes.   Arthralgias:  She also complains of joint pain in her bilateral fingers. She notes her mother has arthritis as well.  Social history:  She has never had any surgery. Never smoker. No drug use or alcohol consumption. She works as a Marine scientist. She is married with two daughters (90 and 83 yo). She has 1 dog at home. In her free time she enjoys doing anything at home. She travels every once in a while.  Family history:  Her mother is still living; she had an MI in 1982, glaucoma and left eye blindness, and dementia. Her father has late-onset diabetes (in his 8's) and had a CVA. Her sister also has diabetes since her 76's. She is the youngest of 4 sisters and 1 brother. She states that her 1st and 2nd siblings have diabetes, and her 5th and 5th siblings have hypertension. Her brother has osteoarthritis in his hip. Her maternal grandfather lived until 87 yo. Her maternal grandmother is 20 yo.  Colonoscopy:  She reports a prior colonoscopy in 2016.  Pap Smear:  Last completed 02/26/2017 per our records. More recently she had a normal pap smear in 2021. We will update this today.  Mammogram:  Last completed 07/11/2022.  Immunizations:  Covid-19 vaccine last received 09/10/2020. Influenza vaccine last received 08/10/2014.  Tdap last received 11/10/2009. First Shingrix vaccine was administered 09/17/2022 at [facility].  Diet:  Follows intermittent fasting for years. She typically eats  between 12 PM and 6 PM.  Exercise:  She walks for 1 hour every day. She confirms her weight is stable. Wt Readings from Last 3 Encounters:  01/30/23 131 lb (59.4 kg)  02/24/20 132 lb 12.8 oz (60.2 kg)  03/05/15 127 lb (57.6 kg)   Cholesterol:  Last year her cholesterol was reportedly slightly above normal.  Vitamin D deficiency:  Last year her vitamin D was found to be low. We will recheck this today.   Dental/Vision:  UTD.  Denies having any fever, new muscle pain, new moles, congestion, sinus pain, sore throat, chest pain, palpitations, cough, SOB, wheezing, n/v/d, constipation, blood in stool, dysuria, frequency, hematuria, at this time.  History reviewed. No pertinent past medical history.  History reviewed. No pertinent surgical history.  Family History  Problem Relation Age of Onset   CAD Mother    Dementia Mother        also had borderline DM2   Diabetes Father    Hypertension Father    CVA Father    Diabetes Sister    Diabetes Mellitus II Sister    Hypertension Sister    Hypertension Sister    Osteoarthritis Brother     Social History   Socioeconomic History   Marital status: Married    Spouse name: Not on file   Number of children: Not on file   Years of education: Not on file   Highest education level: Not on file  Occupational History   Not on file  Tobacco Use   Smoking status: Never   Smokeless tobacco: Never  Vaping Use   Vaping Use: Never used  Substance and Sexual Activity   Alcohol use: No   Drug use: No   Sexual activity: Yes    Birth control/protection: Coitus interruptus  Other Topics Concern   Not on file  Social History Narrative   Works as an Therapist, sports at [employer]   Married   2 daughters- grown   No grandchildren   One dog   Enjoys doing things around the house.    Some travel.    Social Determinants of Health   Financial Resource Strain: Not on file  Food Insecurity: Not on file  Transportation Needs: Not on file  Physical Activity:  Not on file  Stress: Not on file  Social Connections: Not on file  Intimate Partner Violence: Not on file    Outpatient Medications Prior to Visit  Medication Sig Dispense Refill   acetaminophen (TYLENOL) 500 MG tablet Take 500 mg by mouth every 6 (six) hours as needed.     Ascorbic Acid (VITAMIN C) 1000 MG tablet Take 1,000 mg by mouth daily.     Calcium Carbonate-Vitamin D (CALTRATE 600+D) 600-400 MG-UNIT per tablet Take 1 tablet by mouth daily.       Cyanocobalamin (VITAMIN B-12 CR PO) Take 1 tablet by mouth daily.       glucosamine-chondroitin 500-400 MG tablet Take 1 tablet by mouth 2 (two) times daily.     hydrOXYzine (ATARAX/VISTARIL) 25 MG tablet Take 1 tablet (25 mg total) by mouth at bedtime as needed for itching. 30 tablet 2   levocetirizine (XYZAL) 5 MG tablet Take 5 mg by mouth every evening.     loratadine (CLARITIN) 10 MG tablet Take 10 mg by mouth daily.     Turmeric 500 MG CAPS Take by mouth.     Vitamin D, Ergocalciferol, 50000 units CAPS Take 1 capsule by mouth once a week 4 capsule 1   No facility-administered medications prior to visit.    No Known Allergies  Review of Systems  Constitutional:  Negative for fever.  HENT:  Negative for congestion, sinus pain and sore throat.   Respiratory:  Negative for cough, shortness of breath and wheezing.   Cardiovascular:  Negative for chest pain and palpitations.  Gastrointestinal:  Negative for blood in stool, constipation, diarrhea, nausea and vomiting.  Genitourinary:  Negative for dysuria, frequency and hematuria.  Musculoskeletal:  Positive for joint pain (Bilateral fingers). Negative for myalgias.  Skin:  Positive for rash (Left dorsal hand).       Objective:    Physical Exam Exam conducted with a chaperone present.  Constitutional:      Appearance: Normal appearance.  HENT:     Head: Normocephalic and atraumatic.     Right Ear: Tympanic membrane, ear canal and external ear normal.     Left Ear: Tympanic  membrane, ear canal and external ear normal.     Mouth/Throat:     Mouth: Mucous membranes are moist.     Pharynx: Oropharynx is clear. No oropharyngeal exudate.  Eyes:     Extraocular Movements: Extraocular movements intact.     Pupils: Pupils are equal, round, and reactive to light.  Cardiovascular:     Rate and Rhythm: Normal rate and regular rhythm.     Heart sounds: Normal heart sounds. No murmur heard.    No gallop.  Pulmonary:  Effort: Pulmonary effort is normal. No respiratory distress.     Breath sounds: Normal breath sounds. No wheezing or rales.  Chest:  Breasts:    Right: Normal.     Left: Normal.  Genitourinary:    General: Normal vulva.     Vagina: Normal.     Cervix: Normal.     Uterus: Normal.      Adnexa: Right adnexa normal and left adnexa normal.  Musculoskeletal:     Cervical back: Neck supple.     Comments: 5/5 muscle strength of upper and lower extremities.  Skin:    General: Skin is warm and dry.     Comments: Mild rash of left dorsal hand.  Neurological:     General: No focal deficit present.     Mental Status: She is alert and oriented to person, place, and time.     Deep Tendon Reflexes:     Reflex Scores:      Patellar reflexes are 2+ on the right side and 2+ on the left side. Psychiatric:        Mood and Affect: Mood normal.        Behavior: Behavior normal.     BP (!) 114/50 (BP Location: Right Arm, Patient Position: Sitting, Cuff Size: Normal)   Pulse 70   Temp (!) 97.5 F (36.4 C) (Oral)   Resp 16   Ht 5\' 2"  (1.575 m)   Wt 131 lb (59.4 kg)   LMP 03/05/2015   SpO2 100%   BMI 23.96 kg/m  Wt Readings from Last 3 Encounters:  01/30/23 131 lb (59.4 kg)  02/24/20 132 lb 12.8 oz (60.2 kg)  03/05/15 127 lb (57.6 kg)       Assessment & Plan:   Problem List Items Addressed This Visit       Unprioritized   Preventative health care    Continue healthy diet and regular exercise.  Mammo up to date. Pap performed today.  Reports  tetanus up to date. Wants to hold off on Shingrix #2.  Will request records from [facility].       Relevant Orders   Cytology - PAP( [place])   CBC w/Diff   TSH   Eczema    Trial betamethasone.       Other Visit Diagnoses     Mild hyperlipidemia    -  Primary   Relevant Orders   Comp Met (CMET)   Lipid panel   Vitamin D deficiency       Relevant Orders   Vitamin D (25 hydroxy)        Meds ordered this encounter  Medications   betamethasone valerate ointment (VALISONE) 0.1 %    Sig: Apply 1 Application topically 2 (two) times daily.    Dispense:  30 g    Refill:  1    Order Specific Question:   Supervising Provider    Answer:   Penni Homans A [[id number]]    I, Nance Pear, NP, personally preformed the services described in this documentation.  All medical record entries made by the scribe were at my direction and in my presence.  I have reviewed the chart and discharge instructions (if applicable) and agree that the record reflects my personal performance and is accurate and complete. 01/30/2023.  I,Mathew Stumpf,acting as a Education administrator for [employer], NP.,have documented all relevant documentation on the behalf of Nance Pear, NP,as directed by  Nance Pear, NP while in the presence of  Nance Pear, NP.   Nance Pear, NP

## 2023-01-30 NOTE — Assessment & Plan Note (Signed)
Trial betamethasone.

## 2023-01-30 NOTE — Assessment & Plan Note (Addendum)
Continue healthy diet and regular exercise.  Mammo up to date. Pap performed today.  Reports tetanus up to date. Wants to hold off on Shingrix #2.  Will request records from [facility].

## 2023-01-31 LAB — CBC WITH DIFFERENTIAL/PLATELET
Absolute Monocytes: 381 cells/uL (ref 200–950)
Basophils Absolute: 42 cells/uL (ref 0–200)
Eosinophils Absolute: 89 cells/uL (ref 15–500)
HCT: 40.7 % (ref 35.0–45.0)
Hemoglobin: 13.7 g/dL (ref 11.7–15.5)
Lymphs Abs: 1838 cells/uL (ref 850–3900)
MCV: 92.3 fL (ref 80.0–100.0)
MPV: 10.2 fL (ref 7.5–12.5)
RBC: 4.41 10*6/uL (ref 3.80–5.10)
RDW: 12 % (ref 11.0–15.0)
Total Lymphocyte: 39.1 %

## 2023-01-31 LAB — COMPREHENSIVE METABOLIC PANEL
AG Ratio: 1.4 (calc) (ref 1.0–2.5)
ALT: 13 U/L (ref 6–29)
AST: 15 U/L (ref 10–35)
Albumin: 4.6 g/dL (ref 3.6–5.1)
Alkaline phosphatase (APISO): 66 U/L (ref 37–153)
BUN: 11 mg/dL (ref 7–25)
CO2: 29 mmol/L (ref 20–32)
Calcium: 9.5 mg/dL (ref 8.6–10.4)
Chloride: 102 mmol/L (ref 98–110)
Creat: 0.56 mg/dL (ref 0.50–1.03)
Globulin: 3.2 g/dL (calc) (ref 1.9–3.7)
Glucose, Bld: 99 mg/dL (ref 65–99)
Potassium: 4 mmol/L (ref 3.5–5.3)
Sodium: 140 mmol/L (ref 135–146)
Total Bilirubin: 0.5 mg/dL (ref 0.2–1.2)
Total Protein: 7.8 g/dL (ref 6.1–8.1)

## 2023-01-31 LAB — LIPID PANEL
Cholesterol: 227 mg/dL — ABNORMAL HIGH (ref <200)
HDL: 72 mg/dL (ref >=50)
LDL Cholesterol (Calc): 138 mg/dL (calc) — ABNORMAL HIGH
Non-HDL Cholesterol (Calc): 155 mg/dL (calc) — ABNORMAL HIGH (ref <130)
Total CHOL/HDL Ratio: 3.2 (calc) (ref <5.0)
Triglycerides: 77 mg/dL (ref <150)

## 2023-01-31 LAB — TSH: TSH: 1.55 mIU/L

## 2023-01-31 LAB — VITAMIN D 25 HYDROXY (VIT D DEFICIENCY, FRACTURES): Vit D, 25-Hydroxy: 39 ng/mL (ref 30–100)

## 2023-02-02 ENCOUNTER — Other Ambulatory Visit (HOSPITAL_BASED_OUTPATIENT_CLINIC_OR_DEPARTMENT_OTHER): Payer: Self-pay

## 2023-02-04 LAB — CYTOLOGY - PAP: Adequacy: ABNORMAL

## 2023-02-18 NOTE — Progress Notes (Signed)
Subjective:   By signing my name below, I, Christy Martinez, attest that this documentation has been prepared under the direction and in the presence of Christy Fillers, NP 02/20/23   Patient ID: Christy Martinez, female    DOB: Mar 01, 1966, 57 y.o.   MRN: [id number]  Chief Complaint  Patient presents with   Follow-up    Here for repeat pap smear    HPI Patient is in today for an office visit.   Pap: last visit we did not obtain sufficient amount of cells so she presents today for repeat pap.  She would like to complete her second Shingrix shot today.  No past medical history on file.  No past surgical history on file.  Family History  Problem Relation Age of Onset   CAD Mother    Dementia Mother        also had borderline DM2   Diabetes Father    Hypertension Father    CVA Father    Diabetes Sister    Diabetes Mellitus II Sister    Hypertension Sister    Hypertension Sister    Osteoarthritis Brother     Social History   Socioeconomic History   Marital status: Married    Spouse name: Not on file   Number of children: Not on file   Years of education: Not on file   Highest education level: Not on file  Occupational History   Not on file  Tobacco Use   Smoking status: Never   Smokeless tobacco: Never  Vaping Use   Vaping Use: Never used  Substance and Sexual Activity   Alcohol use: No   Drug use: No   Sexual activity: Yes    Birth control/protection: Coitus interruptus  Other Topics Concern   Not on file  Social History Narrative   Works as an Charity fundraiser at [employer]   Married   2 daughters- grown   No grandchildren   One dog   Enjoys doing things around the house.    Some travel.    Social Determinants of Health   Financial Resource Strain: Not on file  Food Insecurity: Not on file  Transportation Needs: Not on file  Physical Activity: Not on file  Stress: Not on file  Social Connections: Not on file  Intimate Partner Violence: Not on file     Outpatient Medications Prior to Visit  Medication Sig Dispense Refill   acetaminophen (TYLENOL) 500 MG tablet Take 500 mg by mouth every 6 (six) hours as needed.     Ascorbic Acid (VITAMIN C) 1000 MG tablet Take 1,000 mg by mouth daily.     betamethasone valerate ointment (VALISONE) 0.1 % Apply 1 Application topically 2 (two) times daily. 30 g 1   Calcium Carbonate-Vitamin D (CALTRATE 600+D) 600-400 MG-UNIT per tablet Take 1 tablet by mouth daily.       Cyanocobalamin (VITAMIN B-12 CR PO) Take 1 tablet by mouth daily.       glucosamine-chondroitin 500-400 MG tablet Take 1 tablet by mouth 2 (two) times daily.     hydrOXYzine (ATARAX/VISTARIL) 25 MG tablet Take 1 tablet (25 mg total) by mouth at bedtime as needed for itching. 30 tablet 2   levocetirizine (XYZAL) 5 MG tablet Take 5 mg by mouth every evening.     loratadine (CLARITIN) 10 MG tablet Take 10 mg by mouth daily.     Turmeric 500 MG CAPS Take by mouth.     Vitamin D, Ergocalciferol, 50000 units  CAPS Take 1 capsule by mouth once a week 4 capsule 1   No facility-administered medications prior to visit.    No Known Allergies  ROS See HPI    Objective:    Physical Exam Exam conducted with a chaperone present.  Constitutional:      Appearance: Normal appearance.  Genitourinary:    General: Normal vulva.     Exam position: Lithotomy position.     Pubic Area: No rash.      Labia:        Right: No rash.        Left: No rash.      Vagina: Normal.     Cervix: Normal.     Uterus: Normal.      Adnexa: Right adnexa normal and left adnexa normal.     Rectum: Normal.  Skin:    General: Skin is warm and dry.  Neurological:     Mental Status: She is alert and oriented to person, place, and time.     BP 104/62 (BP Location: Right Arm, Patient Position: Sitting, Cuff Size: Small)   Pulse (!) 57   Temp 98.4 F (36.9 C) (Oral)   Resp 16   LMP 03/05/2015   SpO2 99%  Wt Readings from Last 3 Encounters:  01/30/23 131 lb  (59.4 kg)  02/24/20 132 lb 12.8 oz (60.2 kg)  03/05/15 127 lb (57.6 kg)       Assessment & Plan:  Encounter for routine gynecological examination with Papanicolaou smear of cervix -     Cytology - PAP    15 minutes spent on today's visit. Time was spent performing pap and counseling pt on menopause.   I,Rachel Rivera,acting as a Neurosurgeonscribe for Christy FillersMelissa S O'Sullivan, NP.,have documented all relevant documentation on the behalf of Christy FillersMelissa S O'Sullivan, NP,as directed by  Christy FillersMelissa S O'Sullivan, NP while in the presence of Christy FillersMelissa S O'Sullivan, NP.   I, Christy FillersMelissa S O'Sullivan, NP, personally preformed the services described in this documentation.  All medical record entries made by the scribe were at my direction and in my presence.  I have reviewed the chart and discharge instructions (if applicable) and agree that the record reflects my personal performance and is accurate and complete. 02/20/23   Christy FillersMelissa S O'Sullivan, NP

## 2023-02-20 ENCOUNTER — Ambulatory Visit: Payer: 59 | Admitting: Family

## 2023-02-20 ENCOUNTER — Other Ambulatory Visit (HOSPITAL_COMMUNITY)
Admission: RE | Admit: 2023-02-20 | Discharge: 2023-02-20 | Disposition: A | Discharge status: Home / Self Care | Payer: 59 | Source: Ambulatory Visit | Attending: Family | Admitting: Family

## 2023-02-20 VITALS — BP 104/62 | HR 57 | Temp 98.4°F | Resp 16

## 2023-02-20 DIAGNOSIS — Z23 Encounter for immunization: Secondary | ICD-10-CM | POA: Diagnosis not present

## 2023-02-20 DIAGNOSIS — Z01419 Encounter for gynecological examination (general) (routine) without abnormal findings: Secondary | ICD-10-CM | POA: Diagnosis not present

## 2023-02-25 LAB — CYTOLOGY - PAP
Comment: NEGATIVE
Diagnosis: NEGATIVE
High risk HPV: NEGATIVE

## 2023-06-26 ENCOUNTER — Other Ambulatory Visit: Payer: Self-pay | Admitting: Family

## 2023-06-26 DIAGNOSIS — Z1231 Encounter for screening mammogram for malignant neoplasm of breast: Secondary | ICD-10-CM

## 2023-07-23 ENCOUNTER — Ambulatory Visit
Admission: RE | Admit: 2023-07-23 | Discharge: 2023-07-23 | Disposition: A | Discharge status: Home / Self Care | Payer: 59 | Source: Ambulatory Visit | Attending: Family | Admitting: Family

## 2023-07-23 DIAGNOSIS — Z1231 Encounter for screening mammogram for malignant neoplasm of breast: Secondary | ICD-10-CM

## 2023-08-20 ENCOUNTER — Other Ambulatory Visit (HOSPITAL_BASED_OUTPATIENT_CLINIC_OR_DEPARTMENT_OTHER): Payer: Self-pay

## 2023-08-20 MED ORDER — AMOXICILLIN 500 MG PO CAPS
500.0000 mg | ORAL_CAPSULE | Freq: Two times a day (BID) | ORAL | 0 refills | Status: DC
  Filled 2023-08-20: qty 20, 10d supply, fill #0

## 2024-01-13 ENCOUNTER — Encounter: Payer: Self-pay | Admitting: Family

## 2024-01-24 NOTE — Telephone Encounter (Signed)
 Please see if we can move her appointment up earlier.

## 2024-01-25 NOTE — Telephone Encounter (Signed)
 Appointment moved to 02/02/24, patient aware.

## 2024-02-02 ENCOUNTER — Other Ambulatory Visit (INDEPENDENT_AMBULATORY_CARE_PROVIDER_SITE_OTHER)

## 2024-02-02 ENCOUNTER — Other Ambulatory Visit: Payer: Self-pay

## 2024-02-02 DIAGNOSIS — Z0000 Encounter for general adult medical examination without abnormal findings: Secondary | ICD-10-CM

## 2024-02-02 DIAGNOSIS — E559 Vitamin D deficiency, unspecified: Secondary | ICD-10-CM | POA: Diagnosis not present

## 2024-02-02 DIAGNOSIS — E785 Hyperlipidemia, unspecified: Secondary | ICD-10-CM | POA: Diagnosis not present

## 2024-02-03 LAB — CBC WITH DIFFERENTIAL/PLATELET
Basophils Absolute: 0 10*3/uL (ref 0.0–0.1)
Basophils Relative: 0.8 % (ref 0.0–3.0)
Eosinophils Absolute: 0.1 10*3/uL (ref 0.0–0.7)
Eosinophils Relative: 2.3 % (ref 0.0–5.0)
HCT: 40.2 % (ref 36.0–46.0)
Hemoglobin: 13.3 g/dL (ref 12.0–15.0)
Lymphocytes Relative: 46.8 % — ABNORMAL HIGH (ref 12.0–46.0)
Lymphs Abs: 2 10*3/uL (ref 0.7–4.0)
MCHC: 33.2 g/dL (ref 30.0–36.0)
MCV: 94.4 fl (ref 78.0–100.0)
Monocytes Absolute: 0.4 10*3/uL (ref 0.1–1.0)
Monocytes Relative: 8.8 % (ref 3.0–12.0)
Neutro Abs: 1.8 10*3/uL (ref 1.4–7.7)
Neutrophils Relative %: 41.3 % — ABNORMAL LOW (ref 43.0–77.0)
Platelets: 225 10*3/uL (ref 150.0–400.0)
RBC: 4.26 Mil/uL (ref 3.87–5.11)
RDW: 13 % (ref 11.5–15.5)
WBC: 4.2 10*3/uL (ref 4.0–10.5)

## 2024-02-03 LAB — LIPID PANEL
Cholesterol: 224 mg/dL — ABNORMAL HIGH (ref 0–200)
HDL: 64.5 mg/dL (ref >39.00)
LDL Cholesterol: 144 mg/dL — ABNORMAL HIGH (ref 0–99)
NonHDL: 159.07
Total CHOL/HDL Ratio: 3
Triglycerides: 76 mg/dL (ref 0.0–149.0)
VLDL: 15.2 mg/dL (ref 0.0–40.0)

## 2024-02-03 LAB — COMPREHENSIVE METABOLIC PANEL WITH GFR
ALT: 15 U/L (ref 0–35)
AST: 18 U/L (ref 0–37)
Albumin: 4.6 g/dL (ref 3.5–5.2)
Alkaline Phosphatase: 55 U/L (ref 39–117)
BUN: 10 mg/dL (ref 6–23)
CO2: 30 meq/L (ref 19–32)
Calcium: 9.4 mg/dL (ref 8.4–10.5)
Chloride: 98 meq/L (ref 96–112)
Creatinine, Ser: 0.58 mg/dL (ref 0.40–1.20)
GFR: 101.24 mL/min (ref >60.00)
Glucose, Bld: 89 mg/dL (ref 70–99)
Potassium: 4 meq/L (ref 3.5–5.1)
Sodium: 137 meq/L (ref 135–145)
Total Bilirubin: 0.7 mg/dL (ref 0.2–1.2)
Total Protein: 7.4 g/dL (ref 6.0–8.3)

## 2024-02-04 LAB — VITAMIN D 25 HYDROXY (VIT D DEFICIENCY, FRACTURES): VITD: 57.58 ng/mL (ref 30.00–100.00)

## 2024-02-04 LAB — TSH: TSH: 0.94 u[IU]/mL (ref 0.35–5.50)

## 2024-02-05 ENCOUNTER — Encounter: Payer: 59 | Admitting: Family

## 2024-02-05 ENCOUNTER — Ambulatory Visit: Payer: 59 | Admitting: Family

## 2024-02-05 ENCOUNTER — Other Ambulatory Visit (HOSPITAL_BASED_OUTPATIENT_CLINIC_OR_DEPARTMENT_OTHER): Payer: Self-pay

## 2024-02-05 VITALS — BP 96/60 | HR 59 | Temp 99.0°F | Resp 16 | Ht 62.0 in | Wt 131.0 lb

## 2024-02-05 DIAGNOSIS — Z1159 Encounter for screening for other viral diseases: Secondary | ICD-10-CM

## 2024-02-05 DIAGNOSIS — Z0000 Encounter for general adult medical examination without abnormal findings: Secondary | ICD-10-CM

## 2024-02-05 DIAGNOSIS — R202 Paresthesia of skin: Secondary | ICD-10-CM | POA: Diagnosis not present

## 2024-02-05 DIAGNOSIS — Z114 Encounter for screening for human immunodeficiency virus [HIV]: Secondary | ICD-10-CM

## 2024-02-05 LAB — B12 AND FOLATE PANEL
Folate: 15.4 ng/mL (ref >5.9)
Vitamin B-12: 409 pg/mL (ref 211–911)

## 2024-02-05 MED ORDER — MELOXICAM 7.5 MG PO TABS
7.5000 mg | ORAL_TABLET | Freq: Every day | ORAL | 0 refills | Status: AC
  Filled 2024-02-05: qty 14, 14d supply, fill #0

## 2024-02-05 NOTE — Assessment & Plan Note (Signed)
  Routine health maintenance up to date. Vaccinations current. Colonoscopy due 2031. Recent normal Pap smear and mammogram. Eye and dental exams current. - Perform HIV and hepatitis C screening.

## 2024-02-05 NOTE — Assessment & Plan Note (Signed)
 I think that RUE numbness is likely due to cervical radiculopathy, but I have more difficulty explaining bilateral facial numbness.  Will give trial of meloxicam, check b12 and folate levels and also place a referral to Neurology.  MS is also in the differential.

## 2024-02-05 NOTE — Patient Instructions (Signed)
 VISIT SUMMARY:  During today's visit, we addressed your recurrent urinary tract infections and yeast infections, discussed weight loss options, and reviewed your arthritis and hypertension concerns. We have made adjustments to your medications and provided a plan to manage your symptoms and improve your overall health.  YOUR PLAN:  -URINARY TRACT INFECTION (UTI): A urinary tract infection is an infection in any part of your urinary system. We have prescribed Bactrim, as you have tolerated it well in the past. We will also send your urine for a culture to ensure the treatment is effective.  -YEAST INFECTION: A yeast infection is a fungal infection that causes itching and discomfort. Since antibiotics for your UTI can worsen this condition, we have prescribed Diflucan. Take one dose initially, a second dose three days later if symptoms persist, and keep the third dose for future use if symptoms recur.  -OBESITY: Obesity is a condition where excess body fat negatively affects your health. We have prescribed Zepbound 2.5 mg weekly to help with weight loss. This medication is covered by your insurance. We will follow up in one month to assess how you are tolerating the medication and its effectiveness.  -ARTHRITIS: Arthritis is inflammation of the joints, which can cause pain and stiffness. Weight loss is encouraged to reduce the pressure on your joints. Continue using your medical shoes for support.  -HYPERTENSION: Hypertension is high blood pressure, which can lead to serious health issues if not managed. We will monitor your blood pressure regularly and discuss the need for medication if it remains elevated.  INSTRUCTIONS:  Please follow up in one month to assess your response to the weight loss medication. Continue to monitor your blood pressure regularly and keep track of any symptoms. If your symptoms worsen or you have any concerns, contact our office.

## 2024-02-05 NOTE — Progress Notes (Signed)
 Subjective:     Patient ID: Christy Martinez, female    DOB: 05-27-1966, 58 y.o.   MRN: [id number]  Chief Complaint  Patient presents with   Annual Exam    HPI  Discussed the use of AI scribe software for clinical note transcription with the patient, who gave verbal consent to proceed.  History of Present Illness  Patient presents today for complete physical.  Christy Au Rumpf "Delorise Shiner" is a 58 year old female who presents with tingling and numbness in the face and right arm.  She experiences intermittent tingling and numbness in her face and right arm. The facial tingling affects both sides and has been present for some time without initially causing concern. The right arm symptoms include tingling, weakness, and numbness, which are also intermittent. The tingling can start in one area and spread, sometimes beginning in the arm and moving upwards.  Her current medications include betamethasone for eczema, used as needed. She has slightly elevated cholesterol levels despite practicing intermittent fasting, eating between 12 PM and 5 PM. She wants to improve her exercise routine, as she previously walked for an hour three times a week but stopped after her walking pad broke.  No current cough, cold symptoms, swelling in her legs, or urinary issues. She reports stable weight, occasional skin concerns, and muscle or joint pain in her hands. No frequent headaches, depression, or anxiety.  Immunizations: one shingrix at [place] and on here Diet: eats 12-5 PM Exercise: used to walk 1 hour 3 x a week.  Wt Readings from Last 3 Encounters:  02/05/24 131 lb (59.4 kg)  01/30/23 131 lb (59.4 kg)  02/24/20 132 lb 12.8 oz (60.2 kg)  Colonoscopy: 2021 Pap Smear: 2024 Mammogram: 9/24 Vision: up to date Dental: up to date      Health Maintenance Due  Topic Date Due   HIV Screening  Never done   Hepatitis C Screening  Never done   COVID-19 Vaccine (2 - 2024-25 season) 07/12/2023    Cervical Cancer Screening (Pap smear)  02/20/2024    No past medical history on file.  History reviewed. No pertinent surgical history.  Family History  Problem Relation Age of Onset   CAD Mother    Dementia Mother        also had borderline DM2   Diabetes Father    Hypertension Father    CVA Father    Diabetes Sister    Diabetes Mellitus II Sister    Hypertension Sister    Hypertension Sister    Osteoarthritis Brother     Social History   Socioeconomic History   Marital status: Married    Spouse name: Not on file   Number of children: Not on file   Years of education: Not on file   Highest education level: Bachelor's degree (e.g., BA, AB, BS)  Occupational History   Not on file  Tobacco Use   Smoking status: Never   Smokeless tobacco: Never  Vaping Use   Vaping status: Never Used  Substance and Sexual Activity   Alcohol use: No   Drug use: No   Sexual activity: Yes    Birth control/protection: Coitus interruptus  Other Topics Concern   Not on file  Social History Narrative   Works as an Charity fundraiser at [employer]   Married   2 daughters- grown   No grandchildren   One dog   Enjoys doing things around the house.    Some travel.  Social Drivers of Corporate investment banker Strain: Low Risk  (02/05/2024)   Overall Financial Resource Strain (CARDIA)    Difficulty of Paying Living Expenses: Not hard at all  Food Insecurity: No Food Insecurity (02/05/2024)   Hunger Vital Sign    Worried About Running Out of Food in the Last Year: Never true    Ran Out of Food in the Last Year: Never true  Transportation Needs: No Transportation Needs (02/05/2024)   PRAPARE - Administrator, Civil Service (Medical): No    Lack of Transportation (Non-Medical): No  Physical Activity: Insufficiently Active (02/05/2024)   Exercise Vital Sign    Days of Exercise per Week: 2 days    Minutes of Exercise per Session: 60 min  Stress: No Stress Concern Present (02/05/2024)   [employer] of Occupational Health - Occupational Stress Questionnaire    Feeling of Stress : Not at all  Social Connections: Socially Integrated (02/05/2024)   Social Connection and Isolation Panel [NHANES]    Frequency of Communication with Friends and Family: More than three times a week    Frequency of Social Gatherings with Friends and Family: Twice a week    Attends Religious Services: More than 4 times per year    Active Member of [employer] or Organizations: Yes    Attends Engineer, structural: More than 4 times per year    Marital Status: Married  Catering manager Violence: Not on file    Outpatient Medications Prior to Visit  Medication Sig Dispense Refill   acetaminophen (TYLENOL) 500 MG tablet Take 500 mg by mouth every 6 (six) hours as needed.     Ascorbic Acid (VITAMIN C) 1000 MG tablet Take 1,000 mg by mouth daily.     betamethasone valerate ointment (VALISONE) 0.1 % Apply 1 Application topically 2 (two) times daily. 30 g 1   Calcium Carbonate-Vitamin D (CALTRATE 600+D) 600-400 MG-UNIT per tablet Take 1 tablet by mouth daily.       Cyanocobalamin (VITAMIN B-12 CR PO) Take 1 tablet by mouth daily.       glucosamine-chondroitin 500-400 MG tablet Take 1 tablet by mouth 2 (two) times daily.     hydrOXYzine (ATARAX/VISTARIL) 25 MG tablet Take 1 tablet (25 mg total) by mouth at bedtime as needed for itching. 30 tablet 2   levocetirizine (XYZAL) 5 MG tablet Take 5 mg by mouth every evening.     loratadine (CLARITIN) 10 MG tablet Take 10 mg by mouth daily.     Turmeric 500 MG CAPS Take by mouth.     Vitamin D, Ergocalciferol, 50000 units CAPS Take 1 capsule by mouth once a week 4 capsule 1   amoxicillin (AMOXIL) 500 MG capsule Take 1 capsule (500 mg total) by mouth every 12 (twelve) hours for 10 days. 20 capsule 0   No facility-administered medications prior to visit.    No Known Allergies  Review of Systems  Constitutional:  Negative for weight loss.  HENT:  Negative for  congestion and hearing loss.   Eyes:  Negative for blurred vision.  Respiratory:  Negative for cough.   Cardiovascular:  Negative for leg swelling.  Gastrointestinal:  Negative for constipation and diarrhea.  Genitourinary:  Negative for dysuria and frequency.  Musculoskeletal:  Positive for joint pain (arthritis in hands). Negative for myalgias.  Skin:  Positive for rash (occasional eczema on hands).  Neurological:  Negative for headaches.  Psychiatric/Behavioral:         Denies  depression anxiety       Objective:    Physical Exam   BP 96/60 (BP Location: Right Arm, Patient Position: Sitting, Cuff Size: Small)   Pulse (!) 59   Temp 99 F (37.2 C) (Oral)   Resp 16   Ht 5\' 2"  (1.575 m)   Wt 131 lb (59.4 kg)   LMP 03/05/2015   SpO2 100%   BMI 23.96 kg/m  Wt Readings from Last 3 Encounters:  02/05/24 131 lb (59.4 kg)  01/30/23 131 lb (59.4 kg)  02/24/20 132 lb 12.8 oz (60.2 kg)   Physical Exam  Constitutional: She is oriented to person, place, and time. She appears well-developed and well-nourished. No distress.  HENT:  Head: Normocephalic and atraumatic.  Right Ear: Tympanic membrane and ear canal normal.  Left Ear: Tympanic membrane and ear canal normal.  Mouth/Throat: Oropharynx is clear and moist.  Eyes: Pupils are equal, round, and reactive to light. No scleral icterus.  Neck: Normal range of motion. No thyromegaly present.  Cardiovascular: Normal rate and regular rhythm.   No murmur heard. Pulmonary/Chest: Effort normal and breath sounds normal. No respiratory distress. He has no wheezes. She has no rales. She exhibits no tenderness.  Abdominal: Soft. Bowel sounds are normal. She exhibits no distension and no mass. There is no tenderness. There is no rebound and no guarding.  Musculoskeletal: She exhibits no edema.  Lymphadenopathy:    She has no cervical adenopathy.  Neurological: She is alert and oriented to person, place, and time. She has normal patellar  reflexes. She exhibits normal muscle tone. Coordination normal.  Skin: Skin is warm and dry.  Psychiatric: She has a normal mood and affect. Her behavior is normal. Judgment and thought content normal.  Breast/pelvic: deferred           Assessment & Plan:       Assessment & Plan:   Problem List Items Addressed This Visit       Unprioritized   Preventative health care    Routine health maintenance up to date. Vaccinations current. Colonoscopy due 2031. Recent normal Pap smear and mammogram. Eye and dental exams current. - Perform HIV and hepatitis C screening.      Paresthesia   I think that RUE numbness is likely due to cervical radiculopathy, but I have more difficulty explaining bilateral facial numbness.  Will give trial of meloxicam, check b12 and folate levels and also place a referral to Neurology.  MS is also in the differential.       Relevant Medications   meloxicam (MOBIC) 7.5 MG tablet   Other Relevant Orders   B12 and Folate Panel   Ambulatory referral to Neurology   Other Visit Diagnoses       Encounter for screening for HIV    -  Primary   Relevant Orders   HIV antibody (with reflex)     Encounter for hepatitis C screening test for low risk patient       Relevant Orders   Hepatitis C Antibody       I have discontinued Don Perking. Einspahr "Grace"'s amoxicillin. I am also having her start on meloxicam. Additionally, I am having her maintain her Cyanocobalamin (VITAMIN B-12 CR PO), Calcium Carbonate-Vitamin D, acetaminophen, vitamin C, glucosamine-chondroitin, Turmeric, loratadine, levocetirizine, hydrOXYzine, Vitamin D (Ergocalciferol), and betamethasone valerate ointment.  Meds ordered this encounter  Medications   meloxicam (MOBIC) 7.5 MG tablet    Sig: Take 1 tablet (7.5 mg total) by mouth daily.  Dispense:  14 tablet    Refill:  0    Supervising Provider:   Danise Edge A [[id number]]

## 2024-02-06 LAB — HEPATITIS C ANTIBODY: Hepatitis C Ab: NONREACTIVE

## 2024-02-06 LAB — HIV ANTIBODY (ROUTINE TESTING W REFLEX): HIV 1&2 Ab, 4th Generation: NONREACTIVE

## 2024-02-08 ENCOUNTER — Encounter: Payer: Self-pay | Admitting: Family

## 2024-02-09 ENCOUNTER — Encounter: Payer: Self-pay | Admitting: Neurology

## 2024-03-08 ENCOUNTER — Ambulatory Visit: Admitting: Neurology

## 2024-03-08 ENCOUNTER — Encounter: Payer: Self-pay | Admitting: Neurology

## 2024-03-08 VITALS — BP 110/66 | HR 72 | Ht 62.0 in | Wt 130.0 lb

## 2024-03-08 DIAGNOSIS — R202 Paresthesia of skin: Secondary | ICD-10-CM | POA: Diagnosis not present

## 2024-03-08 NOTE — Patient Instructions (Signed)
 We will be ordering: Nerve testing of the right arm MRI brain   ELECTROMYOGRAM AND NERVE CONDUCTION STUDIES (EMG/NCS) INSTRUCTIONS  How to Prepare The neurologist conducting the EMG will need to know if you have certain medical conditions. Tell the neurologist and other EMG lab personnel if you: Have a pacemaker or any other electrical medical device Take blood-thinning medications Have hemophilia, a blood-clotting disorder that causes prolonged bleeding Bathing Take a shower or bath shortly before your exam in order to remove oils from your skin. Don't apply lotions or creams before the exam.  What to Expect You'll likely be asked to change into a hospital gown for the procedure and lie down on an examination table. The following explanations can help you understand what will happen during the exam.  Electrodes. The neurologist or a technician places surface electrodes at various locations on your skin depending on where you're experiencing symptoms. Or the neurologist may insert needle electrodes at different sites depending on your symptoms.  Sensations. The electrodes will at times transmit a tiny electrical current that you may feel as a twinge or spasm. The needle electrode may cause discomfort or pain that usually ends shortly after the needle is removed. If you are concerned about discomfort or pain, you may want to talk to the neurologist about taking a short break during the exam.  Instructions. During the needle EMG, the neurologist will assess whether there is any spontaneous electrical activity when the muscle is at rest - activity that isn't present in healthy muscle tissue - and the degree of activity when you slightly contract the muscle.  He or she will give you instructions on resting and contracting a muscle at appropriate times. Depending on what muscles and nerves the neurologist is examining, he or she may ask you to change positions during the exam.  After your EMG You may  experience some temporary, minor bruising where the needle electrode was inserted into your muscle. This bruising should fade within several days. If it persists, contact your primary care doctor.

## 2024-03-08 NOTE — Progress Notes (Signed)
 [facility] Neurology [facility] Clinic Note - Initial Visit   Date: 03/08/2024   Christy Martinez MRN: [id number] DOB: 1966/10/18   Dear Dorrene Gaucher, NP:  Thank you for your kind referral of Christy Martinez for consultation of numbness/tingling. Although her history is well known to you, please allow us  to reiterate it for the purpose of our medical record. The patient was accompanied to the clinic by self.    Christy Martinez is a 58 y.o. right-handed female presenting for evaluation of numbness/tingling of the face and right arm.   IMPRESSION/PLAN: Right arm paresthesias.  Neurological exam is normal.  Symptoms are too diffuse for entrapment neuropathy.  Cervical radiculopathy is possible.   - NCS/EMG of the right arm  2.  Bilateral face paresthesias.  Exam is normal.  TSH and vitamin B12 is also normal.   - MRI brain wwo contrast  Further recommendations pending results.   ------------------------------------------------------------- History of present illness: Around November 2024, she began having numbness/tingling in the arm which would wake her up from sleeping.  This has started to occur during the daytime and lasts 30-seconds to a minute and happens daily.  She has associated hand weakness and has dropped things on occasion.  She also has tingling involving bilateral cheeks, which occurs a few times per week and self resolves in a few seconds.  She has right sided neck pain.  No associated vision changes, difficulty with speech/swallow, or headaches.   Nonsmoker and does not drink alcohol.  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    Out-side paper records, electronic medical record, and images have been reviewed where available and summarized as:  No results found for: "HGBA1C" Lab Results  Component Value Date   VITAMINB12 409 02/05/2024   Lab Results  Component Value Date   TSH 0.94 02/02/2024   Lab Results  Component Value Date    ESRSEDRATE 18 02/24/2020    No past medical history on file.  No past surgical history on file.   Medications:  Outpatient Encounter Medications as of 03/08/2024  Medication Sig   acetaminophen (TYLENOL) 500 MG tablet Take 500 mg by mouth every 6 (six) hours as needed.   Ascorbic Acid (VITAMIN C) 1000 MG tablet Take 1,000 mg by mouth daily.   betamethasone  valerate ointment (VALISONE ) 0.1 % Apply 1 Application topically 2 (two) times daily.   Calcium Carbonate-Vitamin D  (CALTRATE 600+D) 600-400 MG-UNIT per tablet Take 1 tablet by mouth daily.     Cyanocobalamin  (VITAMIN B-12 CR PO) Take 1 tablet by mouth daily.     glucosamine-chondroitin 500-400 MG tablet Take 1 tablet by mouth 2 (two) times daily.   hydrOXYzine  (ATARAX /VISTARIL ) 25 MG tablet Take 1 tablet (25 mg total) by mouth at bedtime as needed for itching.   levocetirizine (XYZAL) 5 MG tablet Take 5 mg by mouth every evening.   loratadine (CLARITIN) 10 MG tablet Take 10 mg by mouth daily.   meloxicam  (MOBIC ) 7.5 MG tablet Take 1 tablet (7.5 mg total) by mouth daily.   Turmeric 500 MG CAPS Take by mouth.   [DISCONTINUED] Vitamin D , Ergocalciferol , 50000 units CAPS Take 1 capsule by mouth once a week (Patient not taking: Reported on 03/08/2024)   No facility-administered encounter medications on file as of 03/08/2024.    Allergies: No Known Allergies  Family History: Family History  Problem Relation Age of Onset   CAD Mother    Dementia Mother  also had borderline DM2   Diabetes Father    Hypertension Father    CVA Father    Diabetes Sister    Diabetes Mellitus II Sister    Hypertension Sister    Hypertension Sister    Osteoarthritis Brother     Social History: Social History   Tobacco Use   Smoking status: Never   Smokeless tobacco: Never  Vaping Use   Vaping status: Never Used  Substance Use Topics   Alcohol use: No   Drug use: No   Social History   Social History Narrative   Works as an Charity fundraiser at [employer]    Married   2 daughters- grown   No grandchildren   One dog   Enjoys doing things around the house.    Some travel.       Are you right handed or left handed? Right handed    Are you currently employed ? 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 No    Who lives with you? Lives with husband and family   What type of home do you live in: 1 story or 2 story? Two story home        Vital Signs:  BP 110/66   Pulse 72   Ht 5\' 2"  (1.575 m)   Wt 130 lb (59 kg)   LMP 03/05/2015   SpO2 100%   BMI 23.78 kg/m     Neurological Exam: MENTAL STATUS including orientation to time, place, person, recent and remote memory, attention span and concentration, language, and fund of knowledge is normal.  Speech is not dysarthric.  CRANIAL NERVES: II:  No visual field defects.     III-IV-VI: Pupils equal round and reactive to light.  Normal conjugate, extra-ocular eye movements in all directions of gaze.  No nystagmus.  No ptosis.   V:  Normal facial sensation.    VII:  Normal facial symmetry and movements.  Pathological facial reflexes are absent.  VIII:  Normal hearing and vestibular function.   IX-X:  Normal palatal movement.   XI:  Normal shoulder shrug and head rotation.   XII:  Normal tongue strength and range of motion, no deviation or fasciculation.  MOTOR:  Motor strength is 5/5 throughout. No atrophy, fasciculations or abnormal movements.  No pronator drift.   MSRs:                                           Right        Left brachioradialis 2+  2+  biceps 2+  2+  triceps 2+  2+  patellar 2+  2+  ankle jerk 2+  2+  Hoffman no  no  plantar response down  down   SENSORY:  Normal and symmetric perception of light touch, pinprick, vibration, and pin prick.  Romberg's sign absent.   COORDINATION/GAIT: Normal finger-to- nose-finger.  Intact rapid alternating movements bilaterally.  Able to rise from a chair without using arms.  Gait narrow based and  stable. Tandem and stressed gait intact.     Thank you for allowing me to participate in patient's care.  If I can answer any additional questions, I would be pleased to do so.    Sincerely,    Dioselina Brumbaugh K. Lydia Sams, DO

## 2024-03-22 ENCOUNTER — Encounter: Payer: Self-pay | Admitting: Neurology

## 2024-03-23 ENCOUNTER — Other Ambulatory Visit

## 2024-03-25 ENCOUNTER — Ambulatory Visit
Admission: RE | Admit: 2024-03-25 | Discharge: 2024-03-25 | Disposition: A | Discharge status: Home / Self Care | Source: Ambulatory Visit | Attending: Neurology | Admitting: Neurology

## 2024-03-25 DIAGNOSIS — R202 Paresthesia of skin: Secondary | ICD-10-CM

## 2024-03-25 MED ORDER — GADOPICLENOL 0.5 MMOL/ML IV SOLN
6.0000 mL | Freq: Once | INTRAVENOUS | Status: AC | PRN
Start: 2024-03-25 — End: 2024-03-25
  Administered 2024-03-25: 6 mL via INTRAVENOUS

## 2024-04-01 ENCOUNTER — Ambulatory Visit: Payer: Self-pay | Admitting: Neurology

## 2024-04-01 ENCOUNTER — Ambulatory Visit: Admitting: Neurology

## 2024-04-01 DIAGNOSIS — G5601 Carpal tunnel syndrome, right upper limb: Secondary | ICD-10-CM

## 2024-04-01 DIAGNOSIS — R202 Paresthesia of skin: Secondary | ICD-10-CM

## 2024-04-01 NOTE — Procedures (Signed)
  [facility] Neurology  [address] [place], [address]  [place], [place] [address] Tel: [phone] Fax: [phone] Test Date:  04/01/2024  Patient: Christy Martinez DOB: Apr 01, 1966 Physician: Reyna Cava, DO  Sex: Female Height: 5\' 2"  Ref Phys: Reyna Cava, DO  ID#: [id number]   Technician:    History: This is a 58 year old female referred for evaluation of right arm paresthesias.  NCV & EMG Findings: Extensive electrodiagnostic testing of the right upper extremity shows:  Right median and ulnar sensory responses are within normal limits.  Right mixed palmar sensory responses show prolonged latency. Right median and ulnar motor responses are within normal limits. There is no evidence of active or chronic motor axonal loss changes affecting any of the tested muscles.  Motor unit configuration and recruitment pattern is within normal limits.  Impression: Right median neuropathy at or distal to the wrist, consistent with a clinical diagnosis of carpal tunnel syndrome.  Overall, these findings are very mild in degree electrically.   ___________________________ Reyna Cava, DO    Nerve Conduction Studies   Stim Site NR Peak (ms) Norm Peak (ms) O-P Amp (V) Norm O-P Amp  Right Median Anti Sensory (2nd Digit)  32 C  Wrist    3.2 <3.6 45.4 >15  Right Ulnar Anti Sensory (5th Digit)  32 C  Wrist    2.3 <3.1 49.4 >10     Stim Site NR Onset (ms) Norm Onset (ms) O-P Amp (mV) Norm O-P Amp Site1 Site2 Delta-0 (ms) Dist (cm) Vel (m/s) Norm Vel (m/s)  Right Median Motor (Abd Poll Brev)  32 C  Wrist    3.4 <4.0 9.7 >6 Elbow Wrist 4.9 31.0 63 >50  Elbow    8.3  9.2         Right Ulnar Motor (Abd Dig Minimi)  32 C  Wrist    1.6 <3.1 9.2 >7 B Elbow Wrist 3.2 21.0 66 >50  B Elbow    4.8  8.7  A Elbow B Elbow 1.7 10.0 59 >50  A Elbow    6.5  8.6            Stim Site NR Peak (ms) Norm Peak (ms) P-T Amp (V) Site1 Site2 Delta-P (ms) Norm Delta (ms)  Right Median/Ulnar Palm Comparison (Wrist  - 8cm)  32 C  Median Palm    1.9 <2.2 54.5 Median Palm Ulnar Palm *0.6   Ulnar Palm    1.3 <2.2 28.9       Electromyography   Side Muscle Ins.Act Fibs Fasc Recrt Amp Dur Poly Activation Comment  Right 1stDorInt Nml Nml Nml Nml Nml Nml Nml Nml N/A  Right Abd Poll Brev Nml Nml Nml Nml Nml Nml Nml Nml N/A  Right PronatorTeres Nml Nml Nml Nml Nml Nml Nml Nml N/A  Right Biceps Nml Nml Nml Nml Nml Nml Nml Nml N/A  Right Triceps Nml Nml Nml Nml Nml Nml Nml Nml N/A  Right Deltoid Nml Nml Nml Nml Nml Nml Nml Nml N/A      Waveforms:

## 2024-04-21 ENCOUNTER — Encounter: Admitting: Neurology

## 2024-05-05 ENCOUNTER — Other Ambulatory Visit: Payer: Self-pay | Admitting: Family

## 2024-05-05 DIAGNOSIS — Z1231 Encounter for screening mammogram for malignant neoplasm of breast: Secondary | ICD-10-CM

## 2024-07-25 ENCOUNTER — Ambulatory Visit
Admission: RE | Admit: 2024-07-25 | Discharge: 2024-07-25 | Disposition: A | Discharge status: Home / Self Care | Source: Ambulatory Visit | Attending: Family | Admitting: Family

## 2024-07-25 DIAGNOSIS — Z1231 Encounter for screening mammogram for malignant neoplasm of breast: Secondary | ICD-10-CM

## 2024-07-27 ENCOUNTER — Ambulatory Visit: Payer: Self-pay | Admitting: Family

## 2024-11-18 ENCOUNTER — Other Ambulatory Visit: Payer: Self-pay

## 2025-02-10 ENCOUNTER — Encounter: Admitting: Family

## 2025-02-15 ENCOUNTER — Encounter: Admitting: Family
